# Patient Record
Sex: Male | Born: 1985 | Race: Black or African American | Hispanic: No | Marital: Single | Smoking: Former smoker
Health system: Southern US, Community
[De-identification: ages and names within clinical notes are randomized; demographics above are authoritative.]

## PROBLEM LIST (undated history)

## (undated) HISTORY — PX: HERNIA REPAIR: SHX51

---

## 2009-03-01 ENCOUNTER — Ambulatory Visit: Payer: Self-pay | Admitting: Interventional Radiology

## 2009-03-01 ENCOUNTER — Emergency Department (HOSPITAL_BASED_OUTPATIENT_CLINIC_OR_DEPARTMENT_OTHER): Admission: EM | Admit: 2009-03-01 | Discharge: 2009-03-01 | Payer: Self-pay | Admitting: Emergency Medicine

## 2012-09-02 ENCOUNTER — Emergency Department (HOSPITAL_BASED_OUTPATIENT_CLINIC_OR_DEPARTMENT_OTHER): Payer: Self-pay

## 2012-09-02 ENCOUNTER — Encounter (HOSPITAL_BASED_OUTPATIENT_CLINIC_OR_DEPARTMENT_OTHER): Payer: Self-pay | Admitting: *Deleted

## 2012-09-02 ENCOUNTER — Emergency Department (HOSPITAL_BASED_OUTPATIENT_CLINIC_OR_DEPARTMENT_OTHER)
Admission: EM | Admit: 2012-09-02 | Discharge: 2012-09-03 | Disposition: A | Discharge status: Home / Self Care | Payer: Self-pay | Attending: Emergency Medicine | Admitting: Emergency Medicine

## 2012-09-02 DIAGNOSIS — W268XXA Contact with other sharp object(s), not elsewhere classified, initial encounter: Secondary | ICD-10-CM | POA: Insufficient documentation

## 2012-09-02 DIAGNOSIS — S41109A Unspecified open wound of unspecified upper arm, initial encounter: Secondary | ICD-10-CM | POA: Insufficient documentation

## 2012-09-02 DIAGNOSIS — S51809A Unspecified open wound of unspecified forearm, initial encounter: Secondary | ICD-10-CM | POA: Insufficient documentation

## 2012-09-02 DIAGNOSIS — T148XXA Other injury of unspecified body region, initial encounter: Secondary | ICD-10-CM

## 2012-09-02 DIAGNOSIS — Y9389 Activity, other specified: Secondary | ICD-10-CM | POA: Insufficient documentation

## 2012-09-02 DIAGNOSIS — F17200 Nicotine dependence, unspecified, uncomplicated: Secondary | ICD-10-CM | POA: Insufficient documentation

## 2012-09-02 DIAGNOSIS — IMO0002 Reserved for concepts with insufficient information to code with codable children: Secondary | ICD-10-CM

## 2012-09-02 DIAGNOSIS — Z23 Encounter for immunization: Secondary | ICD-10-CM | POA: Insufficient documentation

## 2012-09-02 DIAGNOSIS — Y9289 Other specified places as the place of occurrence of the external cause: Secondary | ICD-10-CM | POA: Insufficient documentation

## 2012-09-02 MED ORDER — OXYCODONE-ACETAMINOPHEN 5-325 MG PO TABS
2.0000 | ORAL_TABLET | Freq: Once | ORAL | Status: AC
  Administered 2012-09-03: 2 via ORAL
  Filled 2012-09-02 (×2): qty 2

## 2012-09-02 MED ORDER — TETANUS-DIPHTH-ACELL PERTUSSIS 5-2.5-18.5 LF-MCG/0.5 IM SUSP
0.5000 mL | Freq: Once | INTRAMUSCULAR | Status: AC
  Administered 2012-09-02: 0.5 mL via INTRAMUSCULAR
  Filled 2012-09-02: qty 0.5

## 2012-09-02 NOTE — ED Notes (Signed)
I wrapped patient's right arm with a clean sheet, then poured sterile saline to loosen dried blood.

## 2012-09-02 NOTE — ED Provider Notes (Signed)
History     CSN: [id number]  Arrival date & time 09/02/12  2316   First MD Initiated Contact with Patient 09/02/12 2330      Chief Complaint  Patient presents with  . Laceration    (Consider location/radiation/quality/duration/timing/severity/associated sxs/prior treatment) HPI Comments: Patient presents with several small superficial skin tears of the right forearm and a laceration of the right upper arm.  He reports that just prior to arrival he locked his keys in the car and broke the car window in an attempt to get the keys.  He reports that the window broke and pieces of the glass cut his arm.  Bleeding controlled at this time.  He has full ROM of his arm.  His last tetanus was approximately ten years ago.  He denies numbness or tingling.    Patient is a 27 y.o. male presenting with skin laceration. The history is provided by the patient.  Laceration   History reviewed. No pertinent past medical history.  Past Surgical History  Procedure Laterality Date  . Hernia repair      History reviewed. No pertinent family history.  History  Substance Use Topics  . Smoking status: Current Some Day Smoker  . Smokeless tobacco: Not on file  . Alcohol Use: Yes      Review of Systems  Skin: Positive for wound.  All other systems reviewed and are negative.    Allergies  Review of patient's allergies indicates no known allergies.  Home Medications  No current outpatient prescriptions on file.  BP 135/79  Pulse 76  Temp(Src) 98.1 F (36.7 C) (Oral)  Resp 18  Ht 6' (1.829 m)  Wt 180 lb (81.647 kg)  BMI 24.41 kg/m2  SpO2 97%  Physical Exam  Nursing note and vitals reviewed. Constitutional: He appears well-developed and well-nourished. No distress.  HENT:  Head: Normocephalic and atraumatic.  Cardiovascular: Normal rate, regular rhythm and normal heart sounds.   Pulses:      Radial pulses are 2+ on the right side, and 2+ on the left side.  Pulmonary/Chest: Effort  normal and breath sounds normal.  Musculoskeletal:       Right shoulder: He exhibits normal range of motion.       Right elbow: He exhibits normal range of motion.       Right wrist: He exhibits normal range of motion.  Neurological: He is alert. He has normal strength. No sensory deficit.  Grip strength 5/5 bilaterally  Skin: Skin is warm and dry. He is not diaphoretic.     Several small 0.5 cm skin tears of the right forearm    ED Course  Procedures (including critical care time)  Labs Reviewed - No data to display No results found.   No diagnosis found.  LACERATION REPAIR Performed by: Anne Shutter, Kayl Stogdill Authorized by: Anne Shutter, Herbert Seta Consent: Verbal consent obtained. Risks and benefits: risks, benefits and alternatives were discussed Consent given by: patient Patient identity confirmed: provided demographic data Prepped and Draped in normal sterile fashion Wound explored  Laceration Location: right upper arm  Laceration Length: 4 cm  No Foreign Bodies seen or palpated  Anesthesia: local infiltration  Local anesthetic: lidocaine 2% with epinephrine  Anesthetic total: 6 ml  Irrigation method: syringe Amount of cleaning: standard  Skin closure: 4-0 Prolene  Number of sutures: 8  Technique: Simple interrupted.  Patient tolerance: Patient tolerated the procedure well with no immediate complications.  MDM  Patient presenting with a laceration of the right upper  arm and several small skin tears of his forearm that occurred while breaking a car window to get keys out of the car.  Wounds cleaned well.   Laceration repaired without difficulty.  Good approximation of the skin.  Patient neurovascularly intact.  Patient stable for discharge.        Pascal Lux McClelland, PA-C 09/04/12 1219

## 2012-09-02 NOTE — ED Notes (Signed)
Pt states he was trying to get his keys out of the car and his right hand and arm went through the glass. Multiple lacs noted to same.

## 2012-09-03 MED ORDER — HYDROCODONE-ACETAMINOPHEN 5-325 MG PO TABS: 1.0000 | ORAL_TABLET | Freq: Four times a day (QID) | ORAL | Status: DC | PRN

## 2012-09-03 NOTE — ED Notes (Signed)
Heather, PAC at bedside for laceration repair.

## 2012-09-03 NOTE — ED Notes (Signed)
I cleaned all patient's wounds, then applied bacitracin and placed both small and large dressings and covered with large kerlix over length of arm, then same on patient's hand. Hand wound continues to weep blood. I  advised patient to keep bandage in places till tomorrow. I also advised patient on keeping clean and dry. I gave patient additional wound care supplies.  Bleeding had stopped at time of discharge.

## 2012-09-03 NOTE — ED Notes (Signed)
EMT at bedside providing wound care.

## 2012-09-07 NOTE — ED Provider Notes (Signed)
Medical screening examination/treatment/procedure(s) were performed by non-physician practitioner and as supervising physician I was immediately available for consultation/collaboration.   Gwyneth Sprout, MD 09/07/12 [phone]

## 2013-04-14 ENCOUNTER — Encounter (HOSPITAL_BASED_OUTPATIENT_CLINIC_OR_DEPARTMENT_OTHER): Payer: Self-pay | Admitting: Emergency Medicine

## 2013-04-14 ENCOUNTER — Emergency Department (HOSPITAL_BASED_OUTPATIENT_CLINIC_OR_DEPARTMENT_OTHER)
Admission: EM | Admit: 2013-04-14 | Discharge: 2013-04-14 | Disposition: A | Discharge status: Home / Self Care | Payer: Self-pay | Attending: Emergency Medicine | Admitting: Emergency Medicine

## 2013-04-14 DIAGNOSIS — Y939 Activity, unspecified: Secondary | ICD-10-CM | POA: Insufficient documentation

## 2013-04-14 DIAGNOSIS — S61219A Laceration without foreign body of unspecified finger without damage to nail, initial encounter: Secondary | ICD-10-CM

## 2013-04-14 DIAGNOSIS — Z23 Encounter for immunization: Secondary | ICD-10-CM | POA: Insufficient documentation

## 2013-04-14 DIAGNOSIS — W268XXA Contact with other sharp object(s), not elsewhere classified, initial encounter: Secondary | ICD-10-CM | POA: Insufficient documentation

## 2013-04-14 DIAGNOSIS — Y929 Unspecified place or not applicable: Secondary | ICD-10-CM | POA: Insufficient documentation

## 2013-04-14 DIAGNOSIS — F17200 Nicotine dependence, unspecified, uncomplicated: Secondary | ICD-10-CM | POA: Insufficient documentation

## 2013-04-14 DIAGNOSIS — S61209A Unspecified open wound of unspecified finger without damage to nail, initial encounter: Secondary | ICD-10-CM | POA: Insufficient documentation

## 2013-04-14 MED ORDER — TETANUS-DIPHTH-ACELL PERTUSSIS 5-2.5-18.5 LF-MCG/0.5 IM SUSP
0.5000 mL | Freq: Once | INTRAMUSCULAR | Status: AC
  Administered 2013-04-14: 0.5 mL via INTRAMUSCULAR
  Filled 2013-04-14: qty 0.5

## 2013-04-14 NOTE — ED Provider Notes (Signed)
I have reviewed the report and personally reviewed the above radiology studies.  History/physical exam/procedure(s) were performed by non-physician practitioner and as supervising physician I was immediately available for consultation/collaboration. I have reviewed all notes and am in agreement with care and plan.   Hilario Quarry, MD 04/14/13 2204

## 2013-04-14 NOTE — ED Notes (Signed)
Laceration to left middle finger on metal plate.  Bleeding controlled.

## 2013-04-14 NOTE — ED Provider Notes (Signed)
CSN: [id number]     Arrival date & time 04/14/13  1626 History   First MD Initiated Contact with Patient 04/14/13 1711     Chief Complaint  Patient presents with  . Extremity Laceration   (Consider location/radiation/quality/duration/timing/severity/associated sxs/prior Treatment) Patient is a 27 y.o. male presenting with skin laceration. The history is provided by the patient. No language interpreter was used.  Laceration Location:  Finger Finger laceration location:  L middle finger Length (cm):  0.5 Depth:  Cutaneous Laceration mechanism:  Metal edge Foreign body present:  No foreign bodies Relieved by:  Nothing Tetanus status:  Out of date   History reviewed. No pertinent past medical history. Past Surgical History  Procedure Laterality Date  . Hernia repair     History reviewed. No pertinent family history. History  Substance Use Topics  . Smoking status: Current Some Day Smoker  . Smokeless tobacco: Not on file  . Alcohol Use: Yes    Review of Systems  Skin:       Laceration.    Allergies  Review of patient's allergies indicates no known allergies.  Home Medications  No current outpatient prescriptions on file. BP 139/68  Pulse 81  Temp(Src) 99.1 F (37.3 C) (Oral)  Resp 14  SpO2 99% Physical Exam  Skin:  Less than 1 cm laceration to dorsal 3rd left finger adjacent to PIP joint. Minimal gapping. FROM, no tendon deficits.    ED Course  Procedures (including critical care time) Labs Review Labs Reviewed - No data to display Imaging Review No results found.  EKG Interpretation   None     LACERATION REPAIR Performed by: Elpidio Anis A Authorized by: Elpidio Anis A Consent: Verbal consent obtained. Risks and benefits: risks, benefits and alternatives were discussed Consent given by: patient Patient identity confirmed: provided demographic data Prepped and Draped in normal sterile fashion Wound explored  Laceration Location: left middle  finger  Laceration Length: 0.5 cm  No Foreign Bodies seen or palpated  Anesthesia: local infiltration  Local anesthetic: lidocaine none% none epinephrine  Anesthetic total: none ml  Irrigation method: syringe Amount of cleaning: standard  Skin closure: dermabond, steri-strip  Number of sutures: none  Technique: n/a  Patient tolerance: Patient tolerated the procedure well with no immediate complications.   MDM  No diagnosis found. 1. Finger laceration, left middle    Arnoldo Hooker, PA-C 04/14/13 1813

## 2015-02-03 ENCOUNTER — Emergency Department (HOSPITAL_BASED_OUTPATIENT_CLINIC_OR_DEPARTMENT_OTHER)
Admission: EM | Admit: 2015-02-03 | Discharge: 2015-02-04 | Disposition: A | Discharge status: Home / Self Care | Payer: Self-pay | Attending: Emergency Medicine | Admitting: Emergency Medicine

## 2015-02-03 ENCOUNTER — Encounter (HOSPITAL_BASED_OUTPATIENT_CLINIC_OR_DEPARTMENT_OTHER): Payer: Self-pay | Admitting: Emergency Medicine

## 2015-02-03 ENCOUNTER — Emergency Department (HOSPITAL_BASED_OUTPATIENT_CLINIC_OR_DEPARTMENT_OTHER): Payer: Self-pay

## 2015-02-03 DIAGNOSIS — Y288XXA Contact with other sharp object, undetermined intent, initial encounter: Secondary | ICD-10-CM | POA: Insufficient documentation

## 2015-02-03 DIAGNOSIS — S91112A Laceration without foreign body of left great toe without damage to nail, initial encounter: Secondary | ICD-10-CM | POA: Insufficient documentation

## 2015-02-03 DIAGNOSIS — S91119A Laceration without foreign body of unspecified toe without damage to nail, initial encounter: Secondary | ICD-10-CM

## 2015-02-03 DIAGNOSIS — Y9289 Other specified places as the place of occurrence of the external cause: Secondary | ICD-10-CM | POA: Insufficient documentation

## 2015-02-03 DIAGNOSIS — Z720 Tobacco use: Secondary | ICD-10-CM | POA: Insufficient documentation

## 2015-02-03 DIAGNOSIS — Y9389 Activity, other specified: Secondary | ICD-10-CM | POA: Insufficient documentation

## 2015-02-03 DIAGNOSIS — Y998 Other external cause status: Secondary | ICD-10-CM | POA: Insufficient documentation

## 2015-02-03 MED ORDER — LIDOCAINE HCL (PF) 1 % IJ SOLN
10.0000 mL | Freq: Once | INTRAMUSCULAR | Status: AC
  Administered 2015-02-04: 5 mL
  Filled 2015-02-03: qty 10

## 2015-02-03 NOTE — ED Notes (Addendum)
Patient states that he was walking around with slides on and then felt something brush up against his left toe. The patient has a laceration to his left toe on the sole side of his foot. Patient is slurring his speech - reports that he has had some drinks earlier tonight.

## 2015-02-03 NOTE — ED Notes (Signed)
Pa  at bedside. 

## 2015-02-03 NOTE — ED Provider Notes (Signed)
CSN: [id number]     Arrival date & time 02/03/15  2154 History   First MD Initiated Contact with Patient 02/03/15 2330     Chief Complaint  Patient presents with  . Extremity Laceration     (Consider location/radiation/quality/duration/timing/severity/associated sxs/prior Treatment) HPI Comments: Patient presents today with a laceration to the plantar aspect of the left great toe that he sustained just prior to arrival.  He is unsure how he cut his toe.  He states that he was wearing flip flops and felt something scrape his toe, but is unsure what it was.  Patient admits to drinking alcohol prior to arrival.  He has been able to ambulate since the injury.  He denies any numbness or tingling.  He has full ROM of his toe.  He is unsure of the date of his last tetanus.    The history is provided by the patient.    History reviewed. No pertinent past medical history. Past Surgical History  Procedure Laterality Date  . Hernia repair     History reviewed. No pertinent family history. Social History  Substance Use Topics  . Smoking status: Current Some Day Smoker  . Smokeless tobacco: None  . Alcohol Use: Yes     Comment: drank 2 12 oz tonight    Review of Systems  Constitutional: Negative for fever and chills.  Skin: Positive for wound.  Neurological: Negative for numbness.      Allergies  Review of patient's allergies indicates no known allergies.  Home Medications   Prior to Admission medications   Not on File   BP 125/76 mmHg  Pulse 90  Temp(Src) 97.6 F (36.4 C) (Oral)  Resp 16  Ht  (1.854 m)  Wt 210 lb (95.255 kg)  BMI 27.71 kg/m2  SpO2 100% Physical Exam  Constitutional: He appears well-developed and well-nourished.  HENT:  Head: Normocephalic and atraumatic.  Neck: Normal range of motion.  Cardiovascular: Normal rate, regular rhythm and normal heart sounds.   Pulmonary/Chest: Effort normal and breath sounds normal.  Musculoskeletal:  Full ROM of the  left great toe  Neurological: He is alert.  Distal sensation of the left great toe intact  Skin: Skin is warm and dry.  3 cm u-shaped laceration to the base of the left great toe on the plantar aspect Good capillary refill of the left great toe  Psychiatric: He has a normal mood and affect.  Nursing note and vitals reviewed.   ED Course  Procedures (including critical care time) Labs Review Labs Reviewed - No data to display  Imaging Review Dg Foot Complete Left  02/03/2015   CLINICAL DATA:  Laceration to great toe  EXAM: LEFT FOOT - COMPLETE 3+ VIEW  COMPARISON:  None.  FINDINGS: No fracture dislocation of the first digit. No radiodense foreign body.  IMPRESSION: No fracture or foreign body.   Electronically Signed   By: Genevive Bi M.D.   On: 02/03/2015 22:57   I have personally reviewed and evaluated these images and lab results as part of my medical decision-making.   EKG Interpretation None     LACERATION REPAIR Performed by: Santiago Glad Authorized by: Santiago Glad Consent: Verbal consent obtained. Risks and benefits: risks, benefits and alternatives were discussed Consent given by: patient Patient identity confirmed: provided demographic data Prepped and Draped in normal sterile fashion Wound explored  Laceration Location:  Left great toe  Laceration Length: 3.5 cm  No Foreign Bodies seen or palpated  Anesthesia: digital block  Local anesthetic: lidocaine 2% without epinephrine  Anesthetic total: 5  ml  Irrigation method: syringe Amount of cleaning: standard  Skin closure: 3-0 Prolene  Number of sutures: 6  Technique: simple interrupted  Patient tolerance: Patient tolerated the procedure well with no immediate complications.  MDM   Final diagnoses:  None   Patient presents today with a laceration to the plantar aspect of the left great toe.  Xray negative.  Tetanus updated in the ED.  Laceration repaired with sutures without  difficulty.  Full ROM of the toe.  Neurovascularly intact.  Patient stable for discharge.  Return precautions given.      Santiago Glad, PA-C 02/04/15 1538  Paula Libra, MD 02/04/15 2240

## 2015-02-04 MED ORDER — TRAMADOL HCL 50 MG PO TABS
50.0000 mg | ORAL_TABLET | Freq: Once | ORAL | Status: AC
  Administered 2015-02-04: 50 mg via ORAL
  Filled 2015-02-04: qty 1

## 2015-02-04 NOTE — ED Notes (Signed)
PA at bedside to suture.

## 2015-03-09 ENCOUNTER — Encounter (HOSPITAL_BASED_OUTPATIENT_CLINIC_OR_DEPARTMENT_OTHER): Payer: Self-pay | Admitting: Emergency Medicine

## 2015-03-09 ENCOUNTER — Emergency Department (HOSPITAL_BASED_OUTPATIENT_CLINIC_OR_DEPARTMENT_OTHER)
Admission: EM | Admit: 2015-03-09 | Discharge: 2015-03-09 | Disposition: A | Discharge status: Home / Self Care | Payer: Self-pay | Attending: Emergency Medicine | Admitting: Emergency Medicine

## 2015-03-09 DIAGNOSIS — Z9889 Other specified postprocedural states: Secondary | ICD-10-CM | POA: Insufficient documentation

## 2015-03-09 DIAGNOSIS — R1031 Right lower quadrant pain: Secondary | ICD-10-CM | POA: Insufficient documentation

## 2015-03-09 DIAGNOSIS — Z720 Tobacco use: Secondary | ICD-10-CM | POA: Insufficient documentation

## 2015-03-09 DIAGNOSIS — Z8719 Personal history of other diseases of the digestive system: Secondary | ICD-10-CM

## 2015-03-09 MED ORDER — TRAMADOL HCL 50 MG PO TABS
50.0000 mg | ORAL_TABLET | Freq: Once | ORAL | Status: AC
  Administered 2015-03-09: 50 mg via ORAL
  Filled 2015-03-09: qty 1

## 2015-03-09 MED ORDER — TRAMADOL HCL 50 MG PO TABS: 50.0000 mg | ORAL_TABLET | Freq: Four times a day (QID) | ORAL | Status: DC | PRN

## 2015-03-09 NOTE — Discharge Instructions (Signed)
Follow-up with your surgeon or primary doctor on Monday.  You are welcome to return to the emergency department to undergo the CT scan that I have recommended.

## 2015-03-09 NOTE — ED Provider Notes (Signed)
CSN: [id number]     Arrival date & time 03/09/15  0205 History   First MD Initiated Contact with Patient 03/09/15 [phone]     Chief Complaint  Patient presents with  . Hernia     (Consider location/radiation/quality/duration/timing/severity/associated sxs/prior Treatment) HPI Comments: Patient is a 29 year old male with history of prior right inguinal hernia repair. This was performed approximately 5 years ago by a Careers adviser here in [place]. He presents today with complaints of pain for 1 month. He states that this began shortly after pushing a heavy cart around at work. He denies any bulge in this area. He denies any vomiting, or bowel complaints. He denies any fevers. He denies any urinary complaints.  The history is provided by the patient.    History reviewed. No pertinent past medical history. Past Surgical History  Procedure Laterality Date  . Hernia repair     No family history on file. Social History  Substance Use Topics  . Smoking status: Current Some Day Smoker  . Smokeless tobacco: None  . Alcohol Use: Yes     Comment: drank 2 12 oz tonight    Review of Systems  All other systems reviewed and are negative.     Allergies  Review of patient's allergies indicates no known allergies.  Home Medications   Prior to Admission medications   Not on File   BP 126/66 mmHg  Pulse 75  Temp(Src) 98 F (36.7 C) (Oral)  Resp 18  Ht  (1.854 m)  Wt 190 lb (86.183 kg)  BMI 25.07 kg/m2  SpO2 98% Physical Exam  Constitutional: He is oriented to person, place, and time. He appears well-developed and well-nourished. No distress.  HENT:  Head: Normocephalic and atraumatic.  Neck: Normal range of motion. Neck supple.  Abdominal: Soft. Bowel sounds are normal.  There is tenderness to palpation in the right inguinal region. There is no palpable bulge.  Musculoskeletal: Normal range of motion. He exhibits no edema.  Neurological: He is alert and oriented to person,  place, and time.  Skin: Skin is warm and dry. He is not diaphoretic.  Nursing note and vitals reviewed.   ED Course  Procedures (including critical care time) Labs Review Labs Reviewed - No data to display  Imaging Review No results found. I have personally reviewed and evaluated these images and lab results as part of my medical decision-making.   EKG Interpretation None      MDM   Final diagnoses:  None    Patient presents with complaints of pain in his right groin. He is concerned he may have reinjured his hernia which was previously repaired. Patient was difficult to get an exam on as he repeatedly pushed my hands away. My intent was to perform a CT scan to rule out recurrence of the hernia, however the patient refuses this. He is requesting something for his pain and a work note. He tells me he will follow-up with his surgeon or primary doctor on Monday.     Geoffery Lyons, MD 03/09/15 [phone]

## 2015-03-09 NOTE — ED Notes (Signed)
Pt states he had hernia repair on right side. States he feels like he may have re-injured it. Rates pain 8/10 when walking or moving.

## 2015-04-25 ENCOUNTER — Emergency Department (HOSPITAL_BASED_OUTPATIENT_CLINIC_OR_DEPARTMENT_OTHER)
Admission: EM | Admit: 2015-04-25 | Discharge: 2015-04-25 | Disposition: A | Discharge status: Home / Self Care | Payer: Self-pay | Attending: Emergency Medicine | Admitting: Emergency Medicine

## 2015-04-25 ENCOUNTER — Encounter (HOSPITAL_BASED_OUTPATIENT_CLINIC_OR_DEPARTMENT_OTHER): Payer: Self-pay

## 2015-04-25 DIAGNOSIS — R110 Nausea: Secondary | ICD-10-CM | POA: Insufficient documentation

## 2015-04-25 DIAGNOSIS — R197 Diarrhea, unspecified: Secondary | ICD-10-CM | POA: Insufficient documentation

## 2015-04-25 DIAGNOSIS — R05 Cough: Secondary | ICD-10-CM | POA: Insufficient documentation

## 2015-04-25 DIAGNOSIS — Z87891 Personal history of nicotine dependence: Secondary | ICD-10-CM | POA: Insufficient documentation

## 2015-04-25 MED ORDER — ONDANSETRON 4 MG PO TBDP: ORAL_TABLET | ORAL | Status: DC

## 2015-04-25 NOTE — Discharge Instructions (Signed)

## 2015-04-25 NOTE — ED Provider Notes (Signed)
CSN: [id number]     Arrival date & time 04/25/15  1839 History  By signing my name below, I, Luis Graves, attest that this documentation has been prepared under the direction and in the presence of Luis MoMatthew Jamey Harman, MD. Electronically Signed: Budd PalmerVanessa Graves, ED Scribe. 04/25/2015. 7:12 PM.    Chief Complaint  Patient presents with  . Diarrhea   Patient is a 29 y.o. male presenting with diarrhea. The history is provided by the patient. No language interpreter was used.  Diarrhea Severity:  Moderate Onset quality:  Gradual Duration:  1 day Timing:  Intermittent Progression:  Unchanged Associated symptoms: cough   Associated symptoms: no abdominal pain, no fever and no vomiting    HPI Comments: Luis RickerJerrell Graves is a 29 y.o. male former smoker with a PSHx of hernia repair who presents to the Emergency Department complaining of intermittent diarrhea onset 1 day ago. He reports associated nausea and cough. He believes this may be a stomach virus. 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 Pt denies vomiting, bloody stool, abdominal pain, fever, and congestion.   History reviewed. No pertinent past medical history. Past Surgical History  Procedure Laterality Date  . Hernia repair     No family history on file. Social History  Substance Use Topics  . Smoking status: Former Games developermoker  . Smokeless tobacco: None  . Alcohol Use: Yes     Comment: occ    Review of Systems  Constitutional: Negative for fever.  Respiratory: Positive for cough.   Gastrointestinal: Positive for nausea and diarrhea. Negative for vomiting, abdominal pain and blood in stool.  All other systems reviewed and are negative.   Allergies  Review of patient's allergies indicates no known allergies.  Home Medications   Prior to Admission medications   Medication Sig Start Date End Date Taking? Authorizing Provider  ondansetron (ZOFRAN ODT) 4 MG disintegrating tablet 4mg  ODT q4 hours prn nausea/vomit 04/25/15   Luis MoMatthew  Luis Wainright, MD   BP 126/77 mmHg  Pulse 76  Temp(Src) 98.8 F (37.1 C) (Oral)  Resp 18  Ht 6\' 1"  (1.854 m)  Wt 180 lb (81.647 kg)  BMI 23.75 kg/m2  SpO2 97% Physical Exam  Constitutional: He is oriented to person, place, and time. He appears well-developed and well-nourished.  HENT:  Head: Normocephalic and atraumatic.  Eyes: Conjunctivae and EOM are normal.  Neck: Normal range of motion. Neck supple.  Cardiovascular: Normal rate, regular rhythm and normal heart sounds.   Pulmonary/Chest: Effort normal and breath sounds normal. No respiratory distress.  Abdominal: He exhibits no distension. There is no tenderness. There is no rebound and no guarding.  Musculoskeletal: Normal range of motion.  Neurological: He is alert and oriented to person, place, and time.  Skin: Skin is warm and dry.  Vitals reviewed.   ED Course  Procedures  DIAGNOSTIC STUDIES: Oxygen Saturation is 97% on RA, normal by my interpretation.    COORDINATION OF CARE: 7:00 PM - Discussed probable stomach virus. Discussed plans to order anti-nausea medication to be taken if nausea worsens significantly. Will give a note for work. Pt advised of plan for treatment and pt agrees.  Labs Review Labs Reviewed - No data to display  Imaging Review No results found. I have personally reviewed and evaluated these images and lab results as part of my medical decision-making.   EKG Interpretation None      MDM   Final diagnoses:  Diarrhea, unspecified type    29 y.o. male without pertinent PMH  presents with uncomplicated diarrhea.  Well appearing, no abd pain or tenderness.  DC home in stable condition.    I have reviewed all laboratory and imaging studies if ordered as above  1. Diarrhea, unspecified type           Luis Mo, MD 04/25/15 [phone]

## 2015-04-25 NOTE — ED Notes (Signed)
Diarrhea x 3 days

## 2015-06-26 ENCOUNTER — Encounter (HOSPITAL_BASED_OUTPATIENT_CLINIC_OR_DEPARTMENT_OTHER): Payer: Self-pay | Admitting: *Deleted

## 2015-06-26 ENCOUNTER — Emergency Department (HOSPITAL_BASED_OUTPATIENT_CLINIC_OR_DEPARTMENT_OTHER): Payer: BLUE CROSS/BLUE SHIELD

## 2015-06-26 ENCOUNTER — Emergency Department (HOSPITAL_BASED_OUTPATIENT_CLINIC_OR_DEPARTMENT_OTHER)
Admission: EM | Admit: 2015-06-26 | Discharge: 2015-06-26 | Disposition: A | Discharge status: Home / Self Care | Payer: BLUE CROSS/BLUE SHIELD | Attending: Emergency Medicine | Admitting: Emergency Medicine

## 2015-06-26 DIAGNOSIS — B349 Viral infection, unspecified: Secondary | ICD-10-CM | POA: Diagnosis not present

## 2015-06-26 DIAGNOSIS — Z87891 Personal history of nicotine dependence: Secondary | ICD-10-CM | POA: Insufficient documentation

## 2015-06-26 DIAGNOSIS — R059 Cough, unspecified: Secondary | ICD-10-CM

## 2015-06-26 DIAGNOSIS — R590 Localized enlarged lymph nodes: Secondary | ICD-10-CM | POA: Diagnosis not present

## 2015-06-26 DIAGNOSIS — R05 Cough: Secondary | ICD-10-CM

## 2015-06-26 DIAGNOSIS — R509 Fever, unspecified: Secondary | ICD-10-CM | POA: Diagnosis present

## 2015-06-26 MED ORDER — SODIUM CHLORIDE 0.9 % IV BOLUS (SEPSIS)
1000.0000 mL | Freq: Once | INTRAVENOUS | Status: AC
  Administered 2015-06-26: 1000 mL via INTRAVENOUS

## 2015-06-26 MED ORDER — KETOROLAC TROMETHAMINE 30 MG/ML IJ SOLN
30.0000 mg | Freq: Once | INTRAMUSCULAR | Status: AC
  Administered 2015-06-26: 30 mg via INTRAVENOUS
  Filled 2015-06-26: qty 1

## 2015-06-26 MED ORDER — GUAIFENESIN ER 600 MG PO TB12: 600.0000 mg | ORAL_TABLET | Freq: Two times a day (BID) | ORAL | Status: DC | PRN

## 2015-06-26 MED ORDER — BENZONATATE 100 MG PO CAPS: 100.0000 mg | ORAL_CAPSULE | Freq: Three times a day (TID) | ORAL | Status: DC

## 2015-06-26 MED ORDER — ONDANSETRON 4 MG PO TBDP: 4.0000 mg | ORAL_TABLET | Freq: Three times a day (TID) | ORAL | Status: DC | PRN

## 2015-06-26 MED ORDER — GI COCKTAIL ~~LOC~~
30.0000 mL | Freq: Once | ORAL | Status: AC
  Administered 2015-06-26: 30 mL via ORAL
  Filled 2015-06-26: qty 30

## 2015-06-26 MED ORDER — IBUPROFEN 800 MG PO TABS: 800.0000 mg | ORAL_TABLET | Freq: Three times a day (TID) | ORAL | Status: DC

## 2015-06-26 NOTE — ED Provider Notes (Signed)
CSN: [id number]     Arrival date & time 06/26/15  1750 History  By signing my name below, I, Tanda Rockers, attest that this documentation has been prepared under the direction and in the presence of [name], [place]. Electronically Signed: Tanda Rockers, ED Scribe. 06/26/2015. 6:49 PM.   Chief Complaint  Patient presents with  . Fever   The history is provided by the patient. No language interpreter was used.     HPI Comments: Luis Graves is a 30 y.o. male who presents to the Emergency Department complaining of gradual onset, constant, productive cough with green phlegm x 2 days. Pt also complains of subjective fever, chills, sweats, headache, generalized body aches, diarrhea, and nausea. Pt has been taking 800 mg Ibuprofen and Theraflu with some relief. He did not receive the flu vaccine this year. No recent sick contact with similar symptoms. Denies vomiting, abdominal pain, or any other associated symptoms.    History reviewed. No pertinent past medical history. Past Surgical History  Procedure Laterality Date  . Hernia repair     No family history on file. Social History  Substance Use Topics  . Smoking status: Former Games developer  . Smokeless tobacco: None  . Alcohol Use: Yes     Comment: occ    Review of Systems  Constitutional: Positive for fever (Subjective), chills and diaphoresis.  Respiratory: Positive for cough.   Gastrointestinal: Positive for nausea and diarrhea. Negative for vomiting and abdominal pain.  Musculoskeletal: Positive for myalgias.  Neurological: Positive for headaches.   Allergies  Review of patient's allergies indicates no known allergies.  Home Medications   Prior to Admission medications   Medication Sig Start Date End Date Taking? Authorizing Provider  ondansetron (ZOFRAN ODT) 4 MG disintegrating tablet  ODT q4 hours prn nausea/vomit 04/25/15   Mirian Mo, MD   BP 114/74 mmHg  Pulse 74  Temp(Src) 98.7 F (37.1 C) (Oral)  Resp 18   Ht  (1.854 m)  Wt 180 lb (81.647 kg)  BMI 23.75 kg/m2  SpO2 98%   Physical Exam  Constitutional: He is oriented to person, place, and time. He appears well-developed and well-nourished. No distress.  HENT:  Head: Normocephalic and atraumatic.  Nose: Mucosal edema and rhinorrhea present. Right sinus exhibits no maxillary sinus tenderness and no frontal sinus tenderness. Left sinus exhibits no maxillary sinus tenderness and no frontal sinus tenderness.  Eyes: Conjunctivae and EOM are normal.  Neck: Neck supple. No tracheal deviation present.  Bilateral cervical lymphadenopathy  Cardiovascular: Normal rate.   Pulmonary/Chest: Effort normal. No respiratory distress. He has wheezes. He has no rhonchi. He has no rales.  Left sided few, soft, expiratory wheezes that clear with coughing  Musculoskeletal: Normal range of motion.  Neurological: He is alert and oriented to person, place, and time.  Skin: Skin is warm and dry.  Psychiatric: He has a normal mood and affect. His behavior is normal.  Nursing note and vitals reviewed.   ED Course  Procedures (including critical care time)  DIAGNOSTIC STUDIES: Oxygen Saturation is 98% on RA, normal by my interpretation.    COORDINATION OF CARE: 6:47 PM-Discussed treatment plan which includes CXR with pt at bedside and pt agreed to plan.   Labs Review Labs Reviewed  INFLUENZA PANEL BY PCR (TYPE A & B, H1N1)    Imaging Review Dg Chest 2 View  06/26/2015  CLINICAL DATA:  Fever, cough, chills and body aches for 2 days. Initial encounter. EXAM: CHEST  2 VIEW COMPARISON:  None. FINDINGS: The lungs are clear. Heart size is normal. There is no pneumothorax or pleural effusion. No focal bony abnormality is identified. IMPRESSION: Negative chest. Electronically Signed   By: Drusilla Kanner M.D.   On: 06/26/2015 19:45   I have personally reviewed and evaluated these images and labs as part of my medical decision-making.   EKG  Interpretation None      MDM   Final diagnoses:  Viral syndrome  Cough    Pt reports improvement in pain with toradol. CXR negative. Sent off influena pcr though as I discussed with pt this will not change my treatment. Pt is not hypoxic. He is afebrile. No tachypnea or tachycardia. Likely viral URI. Rx given for supportive meds including motrin, mucinex, tessalon, and zofran.   Prior to discharge pt complaining of heartburn which is common for him. GI cocktail given.     I personally performed the services described in this documentation, which was scribed in my presence. The recorded information has been reviewed and is accurate.      Carlene Coria, PA-C 06/26/15 2025  Alvira Monday, MD 06/27/15 1309

## 2015-06-26 NOTE — ED Notes (Signed)
Fever, cough, chills, body aches x 2 days.

## 2015-06-26 NOTE — Discharge Instructions (Signed)
Your chest x-ray was normal today. You likely have a virus. I will give you prescriptions to help with your symptoms. Drink plenty of fluids to stay hydrated.  Take medications as prescribed. Return to the emergency room for worsening condition or new concerning symptoms. Follow up with your regular doctor. If you don't have a regular doctor use one of the numbers below to establish a primary care doctor.   Emergency Department Resource Guide 1) Find a Doctor and Pay Out of Pocket Although you won't have to find out who is covered by your insurance plan, it is a good idea to ask around and get recommendations. You will then need to call the office and see if the doctor you have chosen will accept you as a new patient and what types of options they offer for patients who are self-pay. Some doctors offer discounts or will set up payment plans for their patients who do not have insurance, but you will need to ask so you aren't surprised when you get to your appointment.  2) Contact Your Local Health Department Not all health departments have doctors that can see patients for sick visits, but many do, so it is worth a call to see if yours does. If you don't know where your local health department is, you can check in your phone book. The CDC also has a tool to help you locate your state's health department, and many state websites also have listings of all of their local health departments.  3) Find a Walk-in Clinic If your illness is not likely to be very severe or complicated, you may want to try a walk in clinic. These are popping up all over the country in pharmacies, drugstores, and shopping centers. They're usually staffed by nurse practitioners or physician assistants that have been trained to treat common illnesses and complaints. They're usually fairly quick and inexpensive. However, if you have serious medical issues or chronic medical problems, these are probably not your best option.  No  Primary Care Doctor: - Call Health Connect at  [phone] - they can help you locate a primary care doctor that  accepts your insurance, provides certain services, etc. - Physician Referral [phone]  Emergency Department Resource Guide 1) Find a Doctor and Pay Out of Pocket Although you won't have to find out who is covered by your insurance plan, it is a good idea to ask around and get recommendations. You will then need to call the office and see if the doctor you have chosen will accept you as a new patient and what types of options they offer for patients who are self-pay. Some doctors offer discounts or will set up payment plans for their patients who do not have insurance, but you will need to ask so you aren't surprised when you get to your appointment.  2) Contact Your Local Health Department Not all health departments have doctors that can see patients for sick visits, but many do, so it is worth a call to see if yours does. If you don't know where your local health department is, you can check in your phone book. The CDC also has a tool to help you locate your state's health department, and many state websites also have listings of all of their local health departments.  3) Find a Walk-in Clinic If your illness is not likely to be very severe or complicated, you may want to try a walk in clinic. These are popping up all over the  country in pharmacies, drugstores, and shopping centers. They're usually staffed by nurse practitioners or physician assistants that have been trained to treat common illnesses and complaints. They're usually fairly quick and inexpensive. However, if you have serious medical issues or chronic medical problems, these are probably not your best option.  No Primary Care Doctor: - Call Health Connect at  [phone] - they can help you locate a primary care doctor that  accepts your insurance, provides certain services, etc. - Physician Referral Service-  [phone]  Chronic Pain Problems: Organization         Address  Phone   Notes  [facility]  [phone] Patients need to be referred by their primary care doctor.   Medication Assistance: Organization         Address  Phone   Notes  [facility] Medication [facility] [address] [place], [place] [address] [phone] --Must be a resident of [facility] -- Must have NO insurance coverage whatsoever (no Medicaid/ Medicare, etc.) -- The pt. MUST have a primary care doctor that directs their care regularly and follows them in the community   [facility]  [phone]   [employer]  [phone]    Agencies that provide inexpensive medical care: Organization         Address  Phone   Notes  [facility]  [phone]   [facility]    [phone]   [facility] [place], [place] [address] [phone]   [facility] [address] [place] [phone]   [facility]    [phone]   [facility]    [phone]   [place] [address], [place] Phone:  [phone], Fax:  [phone] Hours of Operation:  9 am - 6 pm, M-F.  Also accepts Medicaid/Medicare and self-pay.  [facility] [place] for [place], [address], [place] Phone: [phone], Fax: [phone]. Hours of Operation:  8:30 am - 5:30 pm, M-F.  Also accepts Medicaid and self-pay.  [facility] [place] [address], [place] Phone: [phone]   [place], [place], [place] [phone], Ext. [phone] Mondays & Thursdays: 7-9 AM.  First 15 patients are seen on a first come, first serve basis.    [place] Providers:  Organization         Address  Phone   Notes  [facility] [address],  [place] [phone] Also accepts self-pay patients.  [facility] [address], [place]  [phone]   [place], [address], [place] [phone]   [facility] [address] [place] [phone]   Lucianne Lei [address], [place]   [phone] Only accepts [place] patients after they have their name applied to their card.   Self-Pay (no insurance) in [facility]:  Organization         Address  Phone   Notes  Sickle Cell Patients, [facility] [place] [phone]   [facility] [place] [phone]   [facility] [place]  [place] [address], [place] [phone]) [phone]   [facility]/Dr. Osei-Bonsu  [address] [place] [address], [place] or [address], [place] [phone] Phone number for both [place] and [place] locations is the same.  [facility] [address], [place] [phone]   [facility] [place] [address] [place] [address], [place] or [address] [phone]) [phone]   [facility] [address], [place] [phone], phone; [phone], fax Sees patients 1st and 3rd Saturday of every month.  Must not qualify for public or private insurance (i.e. Medicaid, Medicare, [place] Health Choice, Veterans' Benefits)  Household income should be no more than 200% of the poverty level The clinic cannot treat you if you are pregnant or think you are pregnant  Sexually transmitted diseases are not treated at the clinic.

## 2015-06-27 LAB — INFLUENZA PANEL BY PCR (TYPE A & B)
H1N1FLUPCR: NOT DETECTED
INFLAPCR: NEGATIVE
Influenza B By PCR: NEGATIVE

## 2016-06-19 ENCOUNTER — Encounter (HOSPITAL_COMMUNITY): Payer: Self-pay

## 2016-06-19 ENCOUNTER — Emergency Department (HOSPITAL_COMMUNITY)
Admission: EM | Admit: 2016-06-19 | Discharge: 2016-06-19 | Disposition: A | Discharge status: Home / Self Care | Payer: BLUE CROSS/BLUE SHIELD | Attending: Emergency Medicine | Admitting: Emergency Medicine

## 2016-06-19 DIAGNOSIS — K0889 Other specified disorders of teeth and supporting structures: Secondary | ICD-10-CM | POA: Insufficient documentation

## 2016-06-19 DIAGNOSIS — Z5321 Procedure and treatment not carried out due to patient leaving prior to being seen by health care provider: Secondary | ICD-10-CM | POA: Insufficient documentation

## 2016-06-19 NOTE — ED Triage Notes (Signed)
Pt reports right sided dental pain. Pt reports he broke a tooth on the right lower side. Pt A+OX4, speaking in complete sentences.

## 2016-06-19 NOTE — ED Notes (Signed)
Called pt back to fast track x2 with no response.

## 2016-07-17 ENCOUNTER — Encounter (HOSPITAL_COMMUNITY): Payer: Self-pay | Admitting: Emergency Medicine

## 2016-07-17 ENCOUNTER — Emergency Department (HOSPITAL_COMMUNITY)
Admission: EM | Admit: 2016-07-17 | Discharge: 2016-07-17 | Disposition: A | Discharge status: Home / Self Care | Payer: BLUE CROSS/BLUE SHIELD | Attending: Emergency Medicine | Admitting: Emergency Medicine

## 2016-07-17 DIAGNOSIS — Z87891 Personal history of nicotine dependence: Secondary | ICD-10-CM | POA: Insufficient documentation

## 2016-07-17 DIAGNOSIS — K0889 Other specified disorders of teeth and supporting structures: Secondary | ICD-10-CM

## 2016-07-17 DIAGNOSIS — Z79899 Other long term (current) drug therapy: Secondary | ICD-10-CM | POA: Insufficient documentation

## 2016-07-17 MED ORDER — PENICILLIN V POTASSIUM 500 MG PO TABS: 500.0000 mg | ORAL_TABLET | Freq: Four times a day (QID) | ORAL | 0 refills | Status: AC

## 2016-07-17 MED ORDER — OXYCODONE-ACETAMINOPHEN 5-325 MG PO TABS
1.0000 | ORAL_TABLET | Freq: Once | ORAL | Status: AC
  Administered 2016-07-17: 1 via ORAL
  Filled 2016-07-17: qty 1

## 2016-07-17 MED ORDER — TRAMADOL HCL 50 MG PO TABS: 50.0000 mg | ORAL_TABLET | Freq: Four times a day (QID) | ORAL | 0 refills | Status: DC | PRN

## 2016-07-17 MED ORDER — IBUPROFEN 800 MG PO TABS: 800.0000 mg | ORAL_TABLET | Freq: Four times a day (QID) | ORAL | 0 refills | Status: DC | PRN

## 2016-07-17 NOTE — ED Provider Notes (Signed)
[facility]-EMERGENCY DEPT Provider Note   CSN: [id number] Arrival date & time: 07/17/16  2039  By signing my name below, I, Linna Darner, attest that this documentation has been prepared under the direction and in the presence of Audry Pili, PA-C. Electronically Signed: Linna Darner, Scribe. 07/17/2016. 9:53 PM.  History   Chief Complaint Chief Complaint  Patient presents with  . Dental Pain    The history is provided by the patient. No language interpreter was used.     HPI Comments: Luis Graves is a 31 y.o. male who presents to the Emergency Department complaining of intermittent right upper dental pain for one month, constant and severe today. He states his pain is "excruciating." Pt states he chipped part of one of his right upper teeth about a month ago after biting down on a popcorn kernel; he notes he had a filling in the tooth. He endorses pain radiation up the right side of his face. Pt states his pain is worse with applied pressure to the tooth. He tried Tylenol 3 PTA today with minimal improvement of his pain. Pt denies fever, chills, or any other associated symptoms. No dentist.  History reviewed. No pertinent past medical history.  There are no active problems to display for this patient.   Past Surgical History:  Procedure Laterality Date  . HERNIA REPAIR         Home Medications    Prior to Admission medications   Medication Sig Start Date End Date Taking? Authorizing Provider  benzonatate (TESSALON) 100 MG capsule Take 1 capsule (100 mg total) by mouth every 8 (eight) hours. 06/26/15   Ace Gins Sam, PA-C  guaiFENesin (MUCINEX) 600 MG 12 hr tablet Take 1 tablet (600 mg total) by mouth 2 (two) times daily as needed for to loosen phlegm. 06/26/15   Ace Gins Sam, PA-C  ibuprofen (ADVIL,MOTRIN) 800 MG tablet Take 1 tablet (800 mg total) by mouth 3 (three) times daily. 06/26/15   Ace Gins Sam, PA-C  ondansetron (ZOFRAN ODT) 4 MG disintegrating tablet 4mg  ODT q4 hours  prn nausea/vomit 04/25/15   Mirian Mo, MD  ondansetron (ZOFRAN ODT) 4 MG disintegrating tablet Take 1 tablet (4 mg total) by mouth every 8 (eight) hours as needed for nausea or vomiting. 06/26/15   Carlene Coria, PA-C    Family History History reviewed. No pertinent family history.  Social History Social History  Substance Use Topics  . Smoking status: Former Games developer  . Smokeless tobacco: Never Used  . Alcohol use Yes     Comment: occ     Allergies   Patient has no known allergies.   Review of Systems Review of Systems  Constitutional: Negative for chills and fever.  HENT: Positive for dental problem.      Physical Exam Updated Vital Signs BP 142/99 (BP Location: Left Arm)   Pulse (!) 56   Temp 98 F (36.7 C) (Oral)   Resp 18   Ht 6' (1.829 m)   Wt 215 lb (97.5 kg)   SpO2 100%   BMI 29.16 kg/m   Physical Exam  Constitutional: He is oriented to person, place, and time. Vital signs are normal. He appears well-developed and well-nourished. No distress.  HENT:  Head: Normocephalic and atraumatic.  Right Ear: Hearing normal.  Left Ear: Hearing normal.  Mouth/Throat: Uvula is midline, oropharynx is clear and moist and mucous membranes are normal. No trismus in the jaw. Abnormal dentition. Dental caries present. No dental abscesses or uvula swelling. No  oropharyngeal exudate, posterior oropharyngeal edema, posterior oropharyngeal erythema or tonsillar abscesses.  No Trismus. No dental abscess. ROM neck intact without pain. Noted poor dentition with chipped tooth on right lower molar. No abscess. No erythema or purulence.   Eyes: Conjunctivae and EOM are normal. Pupils are equal, round, and reactive to light.  Neck: Neck supple. No tracheal deviation present.  Cardiovascular: Normal rate and regular rhythm.   Pulmonary/Chest: Effort normal. No respiratory distress.  Musculoskeletal: Normal range of motion.  Neurological: He is alert and oriented to person, place, and  time.  Skin: Skin is warm and dry.  Psychiatric: He has a normal mood and affect. His speech is normal and behavior is normal. Thought content normal.  Nursing note and vitals reviewed.   ED Treatments / Results  Labs (all labs ordered are listed, but only abnormal results are displayed) Labs Reviewed - No data to display  EKG  EKG Interpretation None       Radiology No results found.  Procedures Procedures (including critical care time)  DIAGNOSTIC STUDIES: Oxygen Saturation is *100% on RA, normal by my interpretation.    COORDINATION OF CARE: 9:58 PM Discussed treatment plan with pt at bedside and pt agreed to plan.  Medications Ordered in ED Medications - No data to display   Initial Impression / Assessment and Plan / ED Course  I have reviewed the triage vital signs and the nursing notes.  Pertinent labs & imaging results that were available during my care of the patient were reviewed by me and considered in my medical decision making (see chart for details).  Final Clinical Impressions(s) / ED Diagnoses  I have reviewed the relevant previous healthcare records. I obtained HPI from historian.  ED Course:  Assessment: Dental pain associated with dental cary but no signs or symptoms of dental abscess with patient afebrile, non toxic appearing and swallowing secretions well. Exam unconcerning for Ludwig's angina or other deep tissue infection in neck. As there is no facial swelling or gum findings. Will treat with pain medication. Given Rx Penicillin due to duration of symptoms and abscess prevention.  I gave patient referral to dentist and stressed the importance of dental follow up for ultimate management of dental pain. Patient voices understanding and is agreeable to plan.  Disposition/Plan:  DC Home Additional Verbal discharge instructions given and discussed with patient.  Pt Instructed to f/u with Detist in the next week for evaluation and treatment of  symptoms. Return precautions given Pt acknowledges and agrees with plan  Supervising Physician Nira ConnPedro Eduardo Cardama, MD  Final diagnoses:  Pain, dental    New Prescriptions New Prescriptions   No medications on file    I personally performed the services described in this documentation, which was scribed in my presence. The recorded information has been reviewed and is accurate.    Audry Piliyler Usiel Astarita, PA-C 07/17/16 2212    Nira ConnPedro Eduardo Cardama, MD 07/18/16 [phone]

## 2016-07-17 NOTE — ED Triage Notes (Signed)
Pt awoke this morning at 0200 with pain in upperjaw on R side that radiates to ear and lower jaw.  He feels that he has a cavity on the upper right side.  He knows that he has a broken tooth on lower right side.  Pt has no dental coverage currently

## 2016-07-17 NOTE — Discharge Instructions (Signed)
Please read and follow all provided instructions.  Your diagnoses today include:  1. Pain, dental    Tests performed today include: Vital signs. See below for your results today.   Medications prescribed:  Take as prescribed   Home care instructions:  Follow any educational materials contained in this packet.  Follow-up instructions: Please follow-up with your Dentist for further evaluation of symptoms and treatment   Please Contact this number: [phone] to get into contact with an emergency dental service to schedule an appointment with a local dentist   Return instructions:  Please return to the Emergency Department if you do not get better, if you get worse, or new symptoms OR  - Fever (temperature greater than 101.90F)  - Bleeding that does not stop with holding pressure to the area    -Severe pain (please note that you may be more sore the day after your accident)  - Chest Pain  - Difficulty breathing  - Severe nausea or vomiting  - Inability to tolerate food and liquids  - Passing out  - Skin becoming red around your wounds  - Change in mental status (confusion or lethargy)  - New numbness or weakness    Please return if you have any other emergent concerns.  Additional Information:  Your vital signs today were: BP 142/99 (BP Location: Left Arm)    Pulse (!) 56    Temp 98 F (36.7 C) (Oral)    Resp 18    Ht 6' (1.829 m)    Wt 97.5 kg    SpO2 100%    BMI 29.16 kg/m  If your blood pressure (BP) was elevated above 135/85 this visit, please have this repeated by your doctor within one month. ---------------

## 2016-08-10 HISTORY — PX: CERVICAL FUSION: SHX112

## 2016-08-26 ENCOUNTER — Emergency Department (HOSPITAL_COMMUNITY)
Admission: EM | Admit: 2016-08-26 | Discharge: 2016-08-26 | Disposition: A | Discharge status: Home / Self Care | Payer: PRIVATE HEALTH INSURANCE | Attending: Emergency Medicine | Admitting: Emergency Medicine

## 2016-08-26 ENCOUNTER — Encounter (HOSPITAL_COMMUNITY): Payer: Self-pay

## 2016-08-26 DIAGNOSIS — K047 Periapical abscess without sinus: Secondary | ICD-10-CM | POA: Diagnosis not present

## 2016-08-26 DIAGNOSIS — M542 Cervicalgia: Secondary | ICD-10-CM | POA: Diagnosis not present

## 2016-08-26 DIAGNOSIS — H1031 Unspecified acute conjunctivitis, right eye: Secondary | ICD-10-CM | POA: Insufficient documentation

## 2016-08-26 DIAGNOSIS — Z87891 Personal history of nicotine dependence: Secondary | ICD-10-CM | POA: Diagnosis not present

## 2016-08-26 DIAGNOSIS — K0889 Other specified disorders of teeth and supporting structures: Secondary | ICD-10-CM | POA: Diagnosis present

## 2016-08-26 MED ORDER — HYDROCODONE-ACETAMINOPHEN 5-325 MG PO TABS
2.0000 | ORAL_TABLET | Freq: Once | ORAL | Status: AC
  Administered 2016-08-26: 2 via ORAL
  Filled 2016-08-26: qty 2

## 2016-08-26 MED ORDER — AMOXICILLIN 500 MG PO CAPS: 500.0000 mg | ORAL_CAPSULE | Freq: Three times a day (TID) | ORAL | 0 refills | Status: DC

## 2016-08-26 MED ORDER — HYDROCODONE-ACETAMINOPHEN 5-325 MG PO TABS: 2.0000 | ORAL_TABLET | ORAL | 0 refills | Status: DC | PRN

## 2016-08-26 MED ORDER — TOBRAMYCIN 0.3 % OP SOLN: 2.0000 [drp] | OPHTHALMIC | 0 refills | Status: DC

## 2016-08-26 NOTE — Discharge Instructions (Signed)
Return if any problems.

## 2016-08-26 NOTE — ED Triage Notes (Signed)
PT C/O NECK PAIN RADIATING DOWN HIS BACK X2 WEEKS. PT STS HE HAD CERVICAL FUSION SX ON 3/13, BUT HIS PAIN IS THE SAME. HE WENT TO HIS F/U APPT ON Tuesday,A ND WAS TOLD THE PAIN IS NORMAL. DENIES INJURY. PT ALSO C/O RIGHT EYE DRAINAGE AND PAIN X2 WEEKS. DENIES VISUAL CHANGES. PT ALSO C/O LEFT UPPER TOOTH PAIN AND SWELLING X3 DAYS. DENIES FEVER.

## 2016-08-27 NOTE — ED Provider Notes (Signed)
[facility]-EMER[facility] DEPT Provider Note   CSN: [id number] Arrival date & time: 08/26/16  2214     History   Chief Complaint Chief Complaint  Patient presents with  . Eye Drainage    RIGHT  . Oral Swelling  . Neck Pain    HPI Luis Graves is a 31 y.o. male.  The history is provided by the patient. No language interpreter was used.  Neck Pain   This is a chronic problem. The problem has been gradually worsening. The pain is associated with nothing. There has been no fever. The pain is moderate. He has tried nothing for the symptoms.  Pt has pain in his neck after a cervical fusion.  Pt complains of swelling and pain from a tooth left side of his face.  Pt reports drainage from left eye.  Pt thinks he has pink eye  History reviewed. No pertinent past medical history.  There are no active problems to display for this patient.   Past Surgical History:  Procedure Laterality Date  . CERVICAL FUSION  08/10/2016  . HERNIA REPAIR         Home Medications    Prior to Admission medications   Medication Sig Start Date End Date Taking? Authorizing Provider  amoxicillin (AMOXIL) 500 MG capsule Take 1 capsule (500 mg total) by mouth 3 (three) times daily. 08/26/16   Elson Areas, PA-C  benzonatate (TESSALON) 100 MG capsule Take 1 capsule (100 mg total) by mouth every 8 (eight) hours. 06/26/15   Ace Gins Sam, PA-C  guaiFENesin (MUCINEX) 600 MG 12 hr tablet Take 1 tablet (600 mg total) by mouth 2 (two) times daily as needed for to loosen phlegm. 06/26/15   Carlene Coria, PA-C  HYDROcodone-acetaminophen (NORCO/VICODIN) 5-325 MG tablet Take 2 tablets by mouth every 4 (four) hours as needed. 08/26/16   Elson Areas, PA-C  ibuprofen (ADVIL,MOTRIN) 800 MG tablet Take 1 tablet (800 mg total) by mouth every 6 (six) hours as needed. 07/17/16   Audry Pili, PA-C  ondansetron (ZOFRAN ODT) 4 MG disintegrating tablet  ODT q4 hours prn nausea/vomit 04/25/15   Mirian Mo, MD  ondansetron (ZOFRAN  ODT) 4 MG disintegrating tablet Take 1 tablet (4 mg total) by mouth every 8 (eight) hours as needed for nausea or vomiting. 06/26/15   Ace Gins Sam, PA-C  tobramycin (TOBREX) 0.3 % ophthalmic solution Place 2 drops into the right eye every 4 (four) hours. 08/26/16   Elson Areas, PA-C  traMADol (ULTRAM) 50 MG tablet Take 1 tablet (50 mg total) by mouth every 6 (six) hours as needed. 07/17/16   Audry Pili, PA-C    Family History History reviewed. No pertinent family history.  Social History Social History  Substance Use Topics  . Smoking status: Former Games developer  . Smokeless tobacco: Never Used  . Alcohol use Yes     Comment: occ     Allergies   Patient has no known allergies.   Review of Systems Review of Systems  Musculoskeletal: Positive for neck pain.  All other systems reviewed and are negative.    Physical Exam Updated Vital Signs BP (!) 144/90 (BP Location: Left Arm)   Pulse 91   Temp 99.1 F (37.3 C) (Oral)   Resp 19   Ht  (1.854 m)   Wt 93 kg   SpO2 98%   BMI 27.05 kg/m   Physical Exam  Constitutional: He appears well-developed and well-nourished.  HENT:  Head: Normocephalic and atraumatic.  Mouth/Throat: Oropharynx is clear and moist.  Swollen left gumline  Eyes: EOM are normal. Pupils are equal, round, and reactive to light. Right eye exhibits discharge.  Neck: Neck supple.  Cardiovascular: Normal rate and regular rhythm.   No murmur heard. Pulmonary/Chest: Effort normal and breath sounds normal. No respiratory distress.  Abdominal: Soft. There is no tenderness.  Musculoskeletal: He exhibits no edema.  Neurological: He is alert.  Skin: Skin is warm and dry.  Psychiatric: He has a normal mood and affect.  Nursing note and vitals reviewed.    ED Treatments / Results  Labs (all labs ordered are listed, but only abnormal results are displayed) Labs Reviewed - No data to display  EKG  EKG Interpretation None       Radiology No results  found.  Procedures Procedures (including critical care time)  Medications Ordered in ED Medications  HYDROcodone-acetaminophen (NORCO/VICODIN) 5-325 MG per tablet 2 tablet (2 tablets Oral Given 08/26/16 2346)     Initial Impression / Assessment and Plan / ED Course  I have reviewed the triage vital signs and the nursing notes.  Pertinent labs & imaging results that were available during my care of the patient were reviewed by me and considered in my medical decision making (see chart for details).     Meds ordered this encounter  Medications  . amoxicillin (AMOXIL) 500 MG capsule    Sig: Take 1 capsule (500 mg total) by mouth 3 (three) times daily.    Dispense:  21 capsule    Refill:  0    Order Specific Question:   Supervising Provider    Answer:   MILLER, BRIAN [[id number]]  . HYDROcodone-acetaminophen (NORCO/VICODIN) 5-325 MG tablet    Sig: Take 2 tablets by mouth every 4 (four) hours as needed.    Dispense:  10 tablet    Refill:  0    Order Specific Question:   Supervising Provider    Answer:   Hyacinth Meeker, BRIAN [[id number]]  . tobramycin (TOBREX) 0.3 % ophthalmic solution    Sig: Place 2 drops into the right eye every 4 (four) hours.    Dispense:  5 mL    Refill:  0    Order Specific Question:   Supervising Provider    Answer:   MILLER, BRIAN [[id number]]  . HYDROcodone-acetaminophen (NORCO/VICODIN) 5-325 MG per tablet 2 tablet     Final Clinical Impressions(s) / ED Diagnoses   Final diagnoses:  Neck pain  Dental abscess  Acute bacterial conjunctivitis of right eye    New Prescriptions Discharge Medication List as of 08/26/2016 11:33 PM    START taking these medications   Details  amoxicillin (AMOXIL) 500 MG capsule Take 1 capsule (500 mg total) by mouth 3 (three) times daily., Starting Thu 08/26/2016, Print    HYDROcodone-acetaminophen (NORCO/VICODIN) 5-325 MG tablet Take 2 tablets by mouth every 4 (four) hours as needed., Starting Thu 08/26/2016, Print    tobramycin (TOBREX)  0.3 % ophthalmic solution Place 2 drops into the right eye every 4 (four) hours., Starting Thu 08/26/2016, Print      An After Visit Summary was printed and given to the patient.    Lonia Skinner Woodman, PA-C 08/27/16 4098    Pricilla Loveless, MD 08/28/16 [phone]

## 2017-08-29 IMAGING — DX DG FOOT COMPLETE 3+V*L*
3 series · 3 of 3 positions shown · non-contrast
Comparison: None.

CLINICAL DATA: Laceration to great toe

EXAM:
LEFT FOOT - COMPLETE 3+ VIEW

[foot ap]
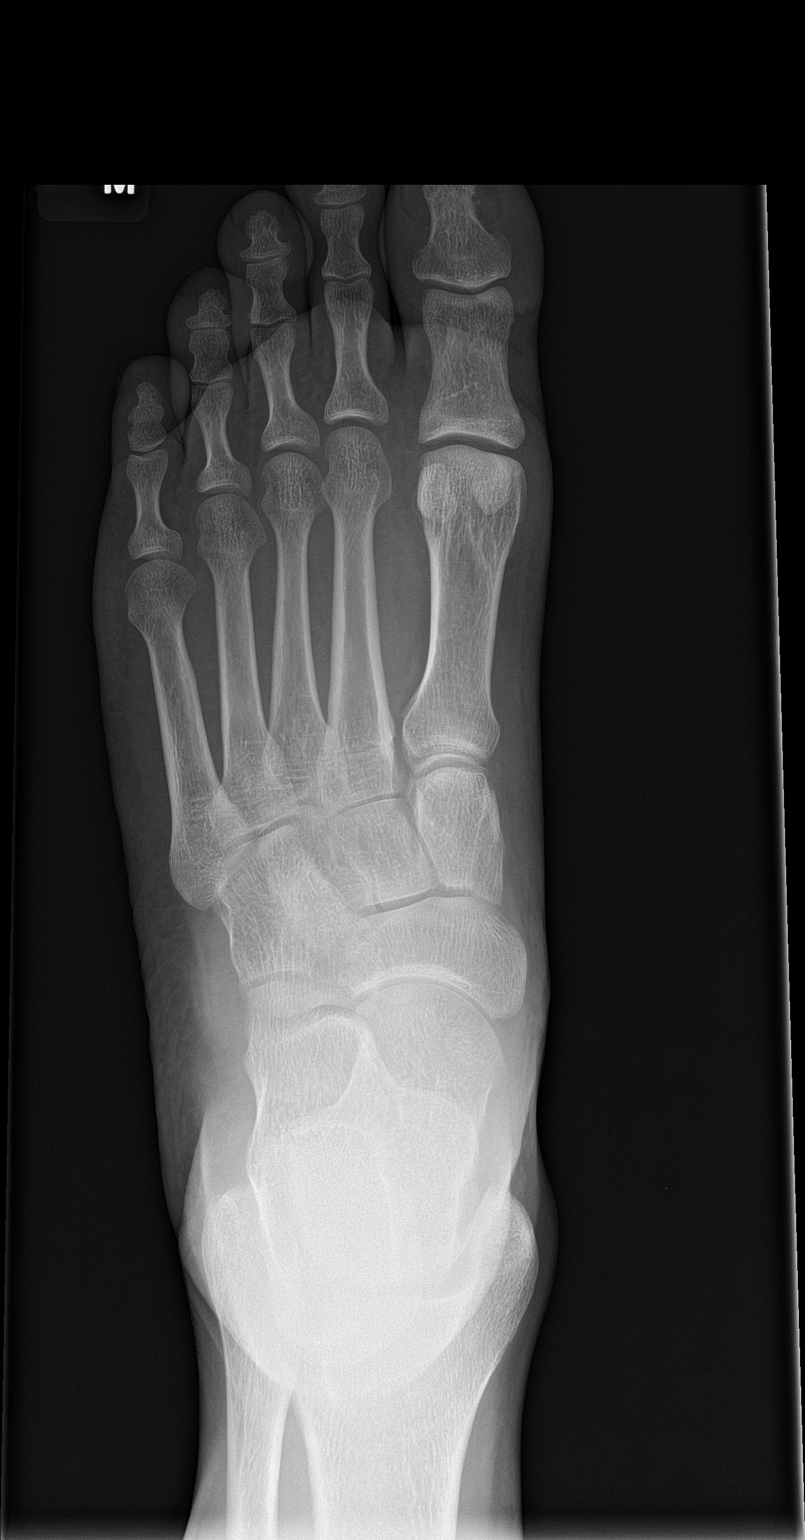

[foot obl]
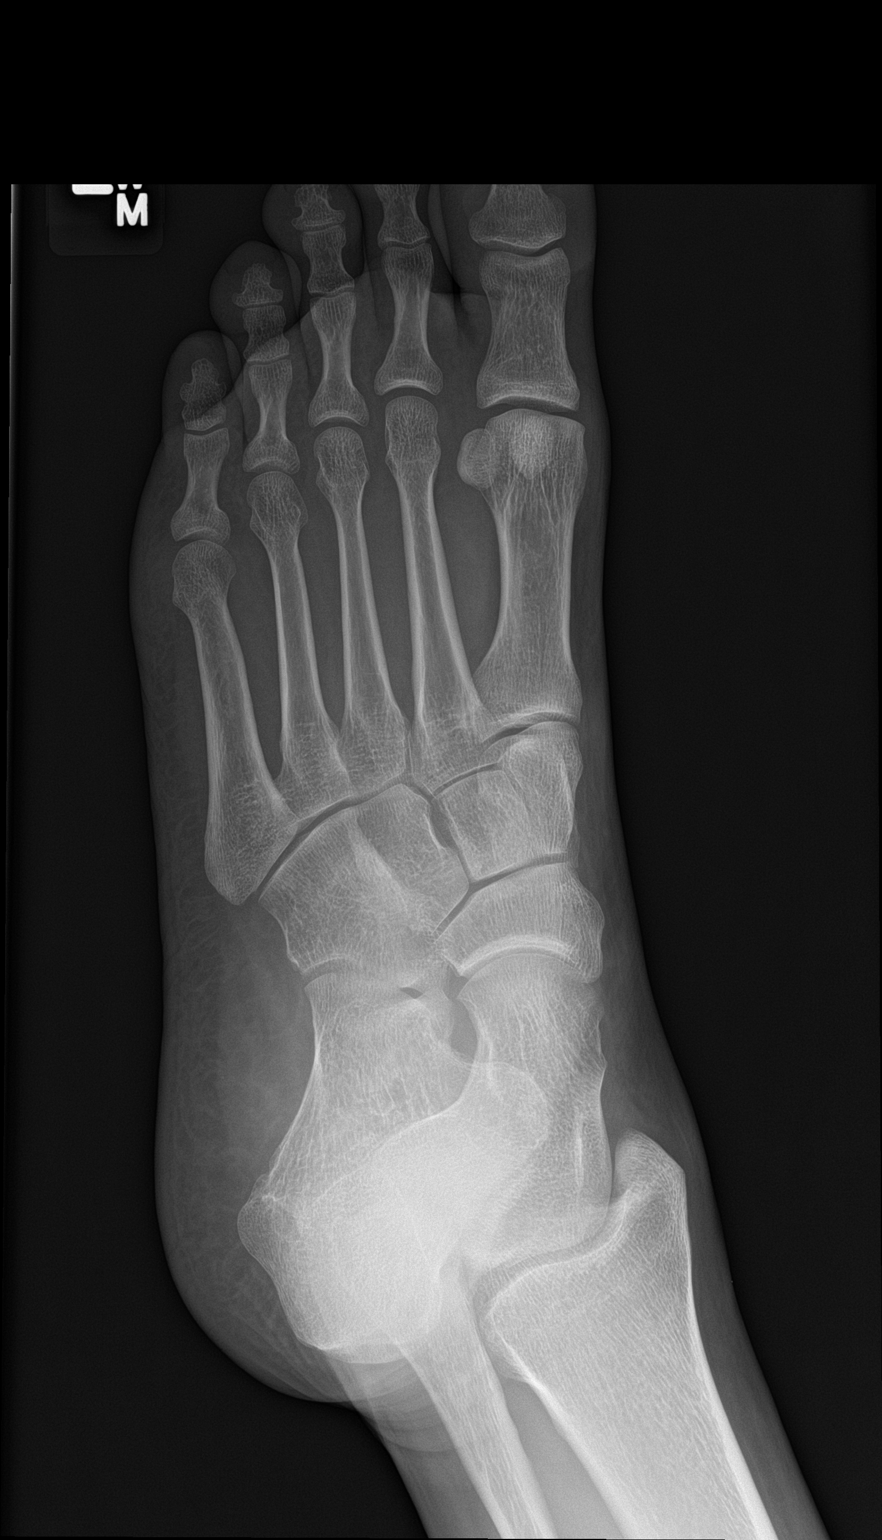

[foot lat]
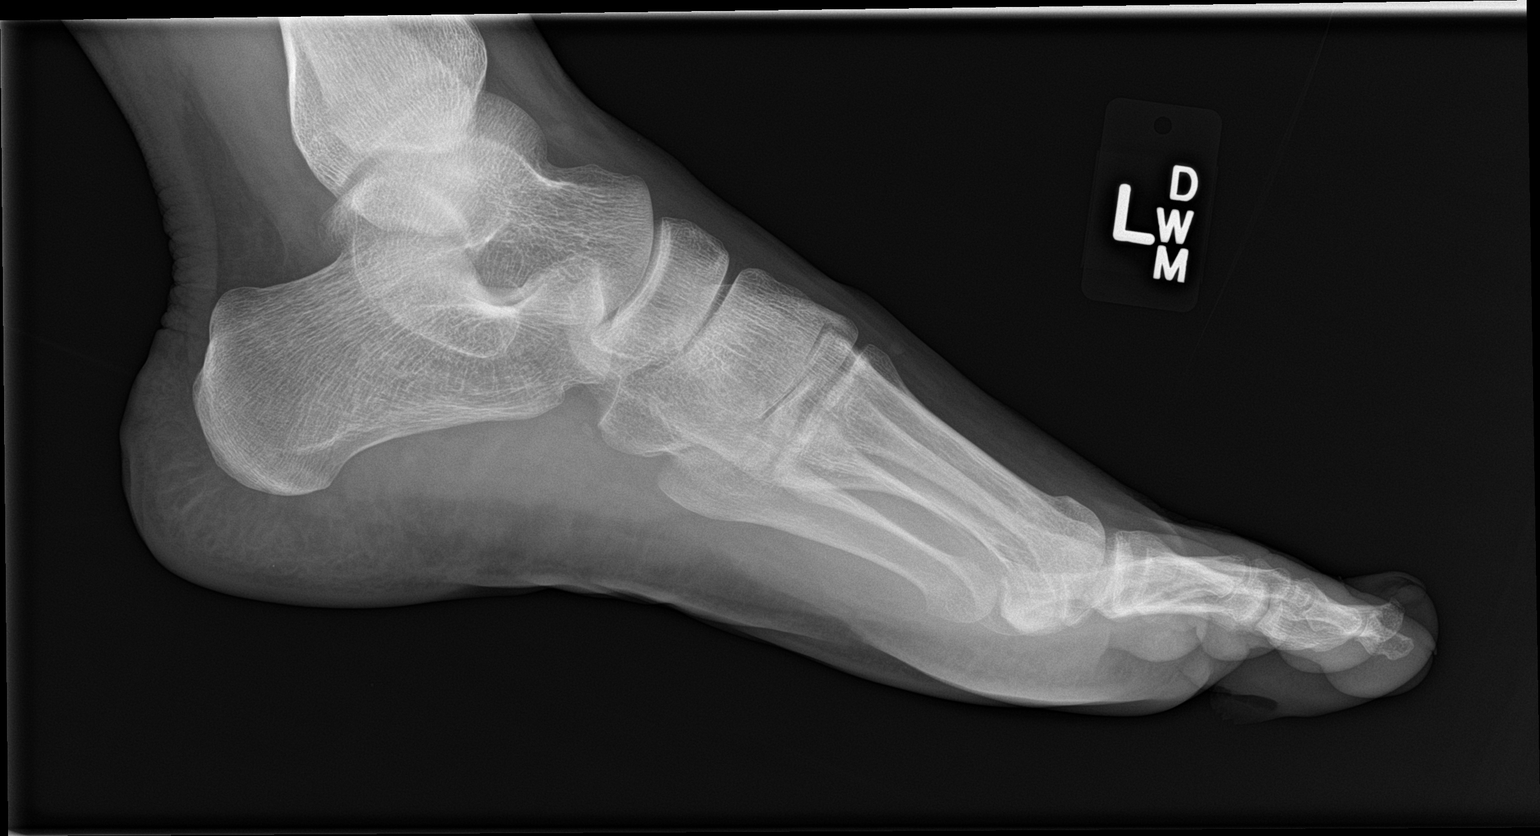

[3 of 3 positions shown; findings below may reference images not displayed]

FINDINGS: No fracture dislocation of the first digit. No radiodense foreign
body.
IMPRESSION: No fracture or foreign body.

## 2018-01-19 IMAGING — DX DG CHEST 2V
2 series · 2 of 2 positions shown · non-contrast
Comparison: None.

CLINICAL DATA: Fever, cough, chills and body aches for 2 days.
Initial encounter.

EXAM:
CHEST  2 VIEW

[chest pa]
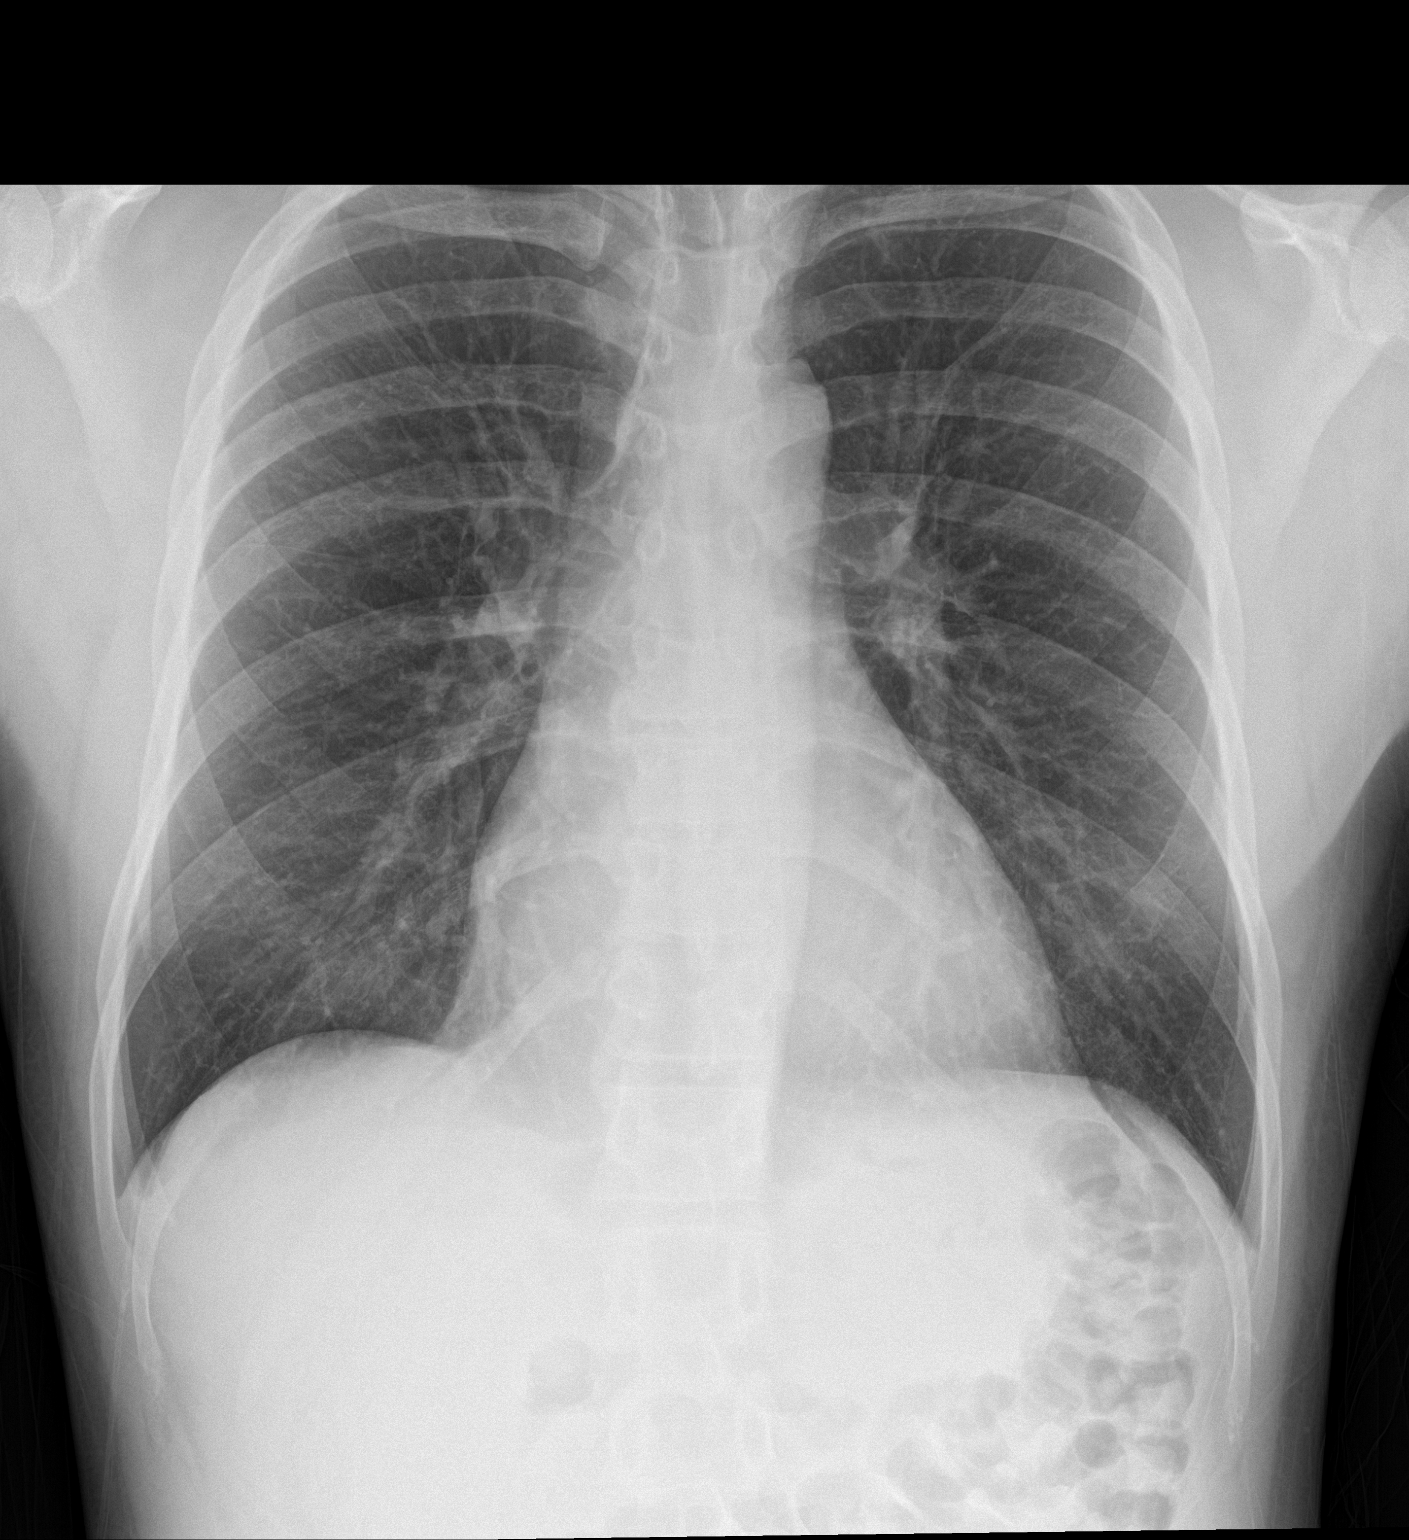

[chest lat]
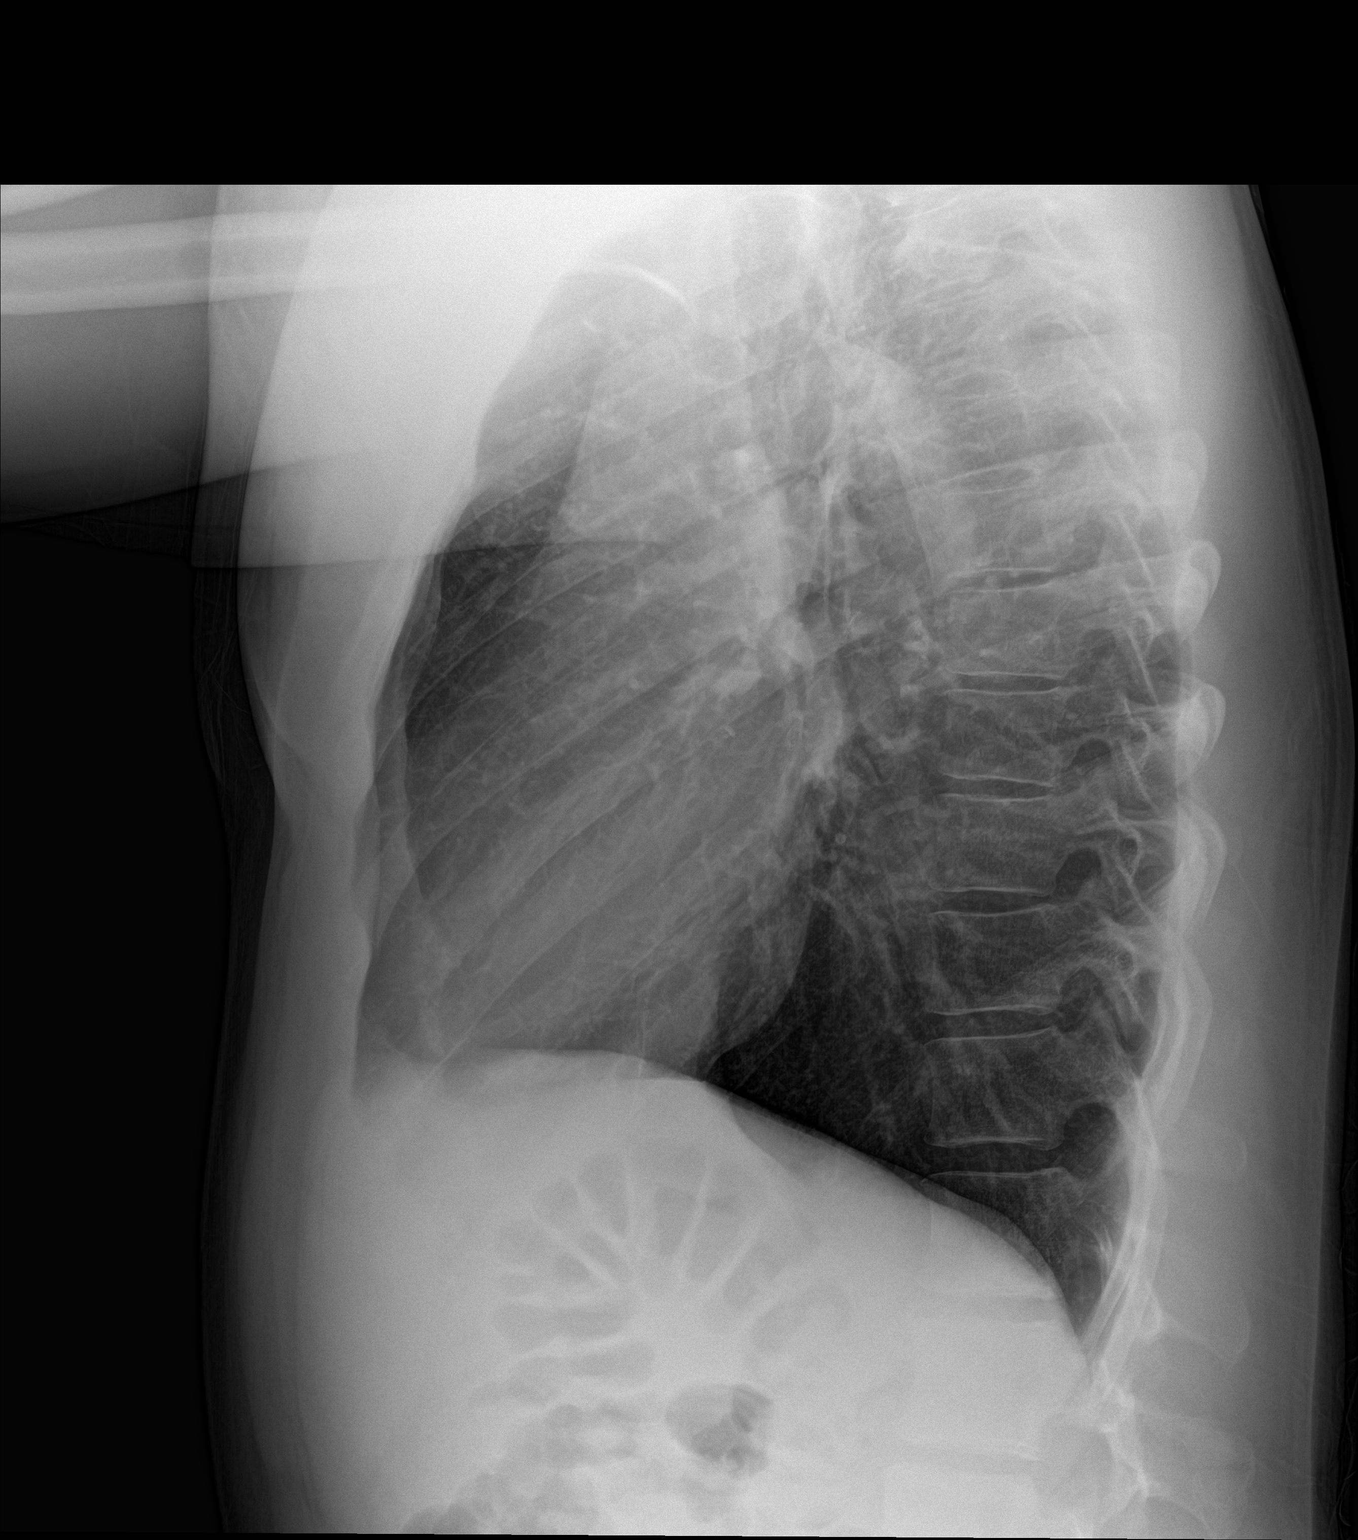

[2 of 2 positions shown; findings below may reference images not displayed]

FINDINGS: The lungs are clear. Heart size is normal. There is no pneumothorax
or pleural effusion. No focal bony abnormality is identified.
IMPRESSION: Negative chest.

## 2018-07-20 ENCOUNTER — Encounter (HOSPITAL_COMMUNITY): Payer: Self-pay

## 2018-07-20 ENCOUNTER — Emergency Department (HOSPITAL_COMMUNITY)
Admission: EM | Admit: 2018-07-20 | Discharge: 2018-07-20 | Disposition: A | Discharge status: Home / Self Care | Payer: PRIVATE HEALTH INSURANCE | Attending: Emergency Medicine | Admitting: Emergency Medicine

## 2018-07-20 ENCOUNTER — Other Ambulatory Visit: Payer: Self-pay

## 2018-07-20 DIAGNOSIS — K0889 Other specified disorders of teeth and supporting structures: Secondary | ICD-10-CM | POA: Insufficient documentation

## 2018-07-20 MED ORDER — AMOXICILLIN 400 MG/5ML PO SUSR: 400.0000 mg | Freq: Three times a day (TID) | ORAL | 0 refills | Status: AC

## 2018-07-20 MED ORDER — OXYCODONE-ACETAMINOPHEN 5-325 MG PO TABS
1.0000 | ORAL_TABLET | Freq: Once | ORAL | Status: AC
  Administered 2018-07-20: 1 via ORAL
  Filled 2018-07-20: qty 1

## 2018-07-20 MED ORDER — IBUPROFEN 200 MG PO TABS
600.0000 mg | ORAL_TABLET | Freq: Once | ORAL | Status: AC
  Administered 2018-07-20: 600 mg via ORAL
  Filled 2018-07-20: qty 3

## 2018-07-20 MED ORDER — AMOXICILLIN 500 MG PO CAPS
500.0000 mg | ORAL_CAPSULE | Freq: Once | ORAL | Status: AC
  Administered 2018-07-20: 500 mg via ORAL
  Filled 2018-07-20: qty 1

## 2018-07-20 NOTE — Discharge Instructions (Addendum)
Today you received medications that may make you sleepy or impair your ability to make decisions.  For the next 24 hours please do not drive, operate heavy machinery, care for a small child with out another adult present, or perform any activities that may cause harm to you or someone else if you were to fall asleep or be impaired.   You may have diarrhea from the antibiotics.  It is very important that you continue to take the antibiotics even if you get diarrhea unless a medical professional tells you that you may stop taking them.  If you stop too early the bacteria you are being treated for will become stronger and you may need different, more powerful antibiotics that have more side effects and worsening diarrhea.  Please stay well hydrated and consider probiotics as they may decrease the severity of your diarrhea.    Please take Ibuprofen (Advil, motrin) and Tylenol (acetaminophen) to relieve your pain.  You may take up to 600 MG (3 pills) of normal strength ibuprofen every 8 hours as needed.  In between doses of ibuprofen you make take tylenol, up to 1,000 mg (two extra strength pills).  Do not take more than 3,000 mg tylenol in a 24 hour period.  Please check all medication labels as many medications such as pain and cold medications may contain tylenol.  Do not drink alcohol while taking these medications.  Do not take other NSAID'S while taking ibuprofen (such as aleve or naproxen).  Please take ibuprofen with food to decrease stomach upset.

## 2018-07-20 NOTE — ED Provider Notes (Signed)
[facility]-EMERGENCY DEPT Provider Note   CSN: [id number] Arrival date & time: 07/20/18  2102    History   Chief Complaint Chief Complaint  Patient presents with  . Dental Pain    HPI Luis Graves is a 33 y.o. male who presents today for evaluation of left-sided dental pain.  He states that a few months ago he had a filling fall out and he has not followed up to get it fixed.  He denies any fevers.  He reports he has been trying aspirin, BC powder, and leftover tramadol without significant relief.  He denies any allergies.  No nausea or vomiting.  No fevers.  He has not attempted to contact the dentist.     HPI  History reviewed. No pertinent past medical history.  There are no active problems to display for this patient.   Past Surgical History:  Procedure Laterality Date  . CERVICAL FUSION  08/10/2016  . HERNIA REPAIR          Home Medications    Prior to Admission medications   Medication Sig Start Date End Date Taking? Authorizing Provider  amoxicillin (AMOXIL) 400 MG/5ML suspension Take 5 mLs (400 mg total) by mouth 3 (three) times daily for 7 days. 07/20/18 07/27/18  Cristina Gong, PA-C  benzonatate (TESSALON) 100 MG capsule Take 1 capsule (100 mg total) by mouth every 8 (eight) hours. 06/26/15   Sam, Ace Gins, PA-C  guaiFENesin (MUCINEX) 600 MG 12 hr tablet Take 1 tablet (600 mg total) by mouth 2 (two) times daily as needed for to loosen phlegm. 06/26/15   Sam, Ace Gins, PA-C  HYDROcodone-acetaminophen (NORCO/VICODIN) 5-325 MG tablet Take 2 tablets by mouth every 4 (four) hours as needed. 08/26/16   Elson Areas, PA-C  ibuprofen (ADVIL,MOTRIN) 800 MG tablet Take 1 tablet (800 mg total) by mouth every 6 (six) hours as needed. 07/17/16   Audry Pili, PA-C  ondansetron (ZOFRAN ODT) 4 MG disintegrating tablet 4mg  ODT q4 hours prn nausea/vomit 04/25/15   Mirian Mo, MD  ondansetron (ZOFRAN ODT) 4 MG disintegrating tablet Take 1 tablet (4  mg total) by mouth every 8 (eight) hours as needed for nausea or vomiting. 06/26/15   Sam, Ace Gins, PA-C  tobramycin (TOBREX) 0.3 % ophthalmic solution Place 2 drops into the right eye every 4 (four) hours. 08/26/16   Elson Areas, PA-C  traMADol (ULTRAM) 50 MG tablet Take 1 tablet (50 mg total) by mouth every 6 (six) hours as needed. 07/17/16   Audry Pili, PA-C    Family History No family history on file.  Social History Social History   Tobacco Use  . Smoking status: Former Games developer  . Smokeless tobacco: Never Used  Substance Use Topics  . Alcohol use: Yes    Comment: occ  . Drug use: No     Allergies   Patient has no known allergies.   Review of Systems Review of Systems  Constitutional: Negative for chills and fever.  HENT: Positive for dental problem and facial swelling. Negative for congestion, drooling, sinus pain and trouble swallowing.   Respiratory: Negative for stridor.   Gastrointestinal: Negative for abdominal pain and nausea.  All other systems reviewed and are negative.    Physical Exam Updated Vital Signs BP 132/80 (BP Location: Left Arm)   Pulse (!) 56   Temp 98 F (36.7 C) (Oral)   Resp 16   Ht 6' (1.829 m)   Wt 93 kg   SpO2 98%  BMI 27.80 kg/m   Physical Exam Vitals signs and nursing note reviewed.  Constitutional:      General: He is not in acute distress. HENT:     Head: Normocephalic and atraumatic.     Jaw: There is normal jaw occlusion. No trismus, swelling, pain on movement or malocclusion.     Salivary Glands: Right salivary gland is not diffusely enlarged or tender. Left salivary gland is not diffusely enlarged or tender.     Comments: Mild edema of the left sided lower jaw, edema does not extend below mandible.  No elevation of the floor of the mouth.  No significant submandibular swelling.  Dentition is in generally poor state.  There is no intraoral edema. On the left mandible there is what appears to be a premolar with a missing  filling.  Teeth are in generally poor state with missing teeth.    Mouth/Throat:     Mouth: Mucous membranes are moist.     Palate: No mass.     Pharynx: Oropharynx is clear. Uvula midline. No oropharyngeal exudate.  Neck:     Musculoskeletal: Normal range of motion and neck supple.  Lymphadenopathy:     Cervical: No cervical adenopathy.  Skin:    General: Skin is warm.  Neurological:     General: No focal deficit present.     Mental Status: He is alert. Mental status is at baseline.  Psychiatric:        Mood and Affect: Mood normal.        Behavior: Behavior normal.      ED Treatments / Results  Labs (all labs ordered are listed, but only abnormal results are displayed) Labs Reviewed - No data to display  EKG None  Radiology No results found.  Procedures Procedures (including critical care time)  Medications Ordered in ED Medications  oxyCODONE-acetaminophen (PERCOCET/ROXICET) 5-325 MG per tablet 1 tablet (1 tablet Oral Given 07/20/18 2201)  amoxicillin (AMOXIL) capsule 500 mg (500 mg Oral Given 07/20/18 2201)  ibuprofen (ADVIL,MOTRIN) tablet 600 mg (600 mg Oral Given 07/20/18 2201)     Initial Impression / Assessment and Plan / ED Course  I have reviewed the triage vital signs and the nursing notes.  Pertinent labs & imaging results that were available during my care of the patient were reviewed by me and considered in my medical decision making (see chart for details).       Patient with toothache.  No gross abscess.  Exam unconcerning for Ludwig's angina or spread of infection.  Will treat with penicillin and pain medicine.  Urged patient to follow-up with dentist.     Final Clinical Impressions(s) / ED Diagnoses   Final diagnoses:  Pain, dental    ED Discharge Orders         Ordered    amoxicillin (AMOXIL) 400 MG/5ML suspension  3 times daily     07/20/18 2138           Cristina Gong, [place] 07/21/18 Mike Gip    Cathren Laine, MD 07/21/18  [phone]

## 2018-07-20 NOTE — ED Triage Notes (Signed)
Pt arrives by POV due to left sided dental pain x 1 week. Pt states he has not called his dentist to follow up. Pain 10/10.

## 2018-09-14 ENCOUNTER — Other Ambulatory Visit: Payer: Self-pay

## 2018-09-14 ENCOUNTER — Encounter (HOSPITAL_COMMUNITY): Payer: Self-pay | Admitting: Emergency Medicine

## 2018-09-14 ENCOUNTER — Ambulatory Visit (HOSPITAL_COMMUNITY)
Admission: EM | Admit: 2018-09-14 | Discharge: 2018-09-14 | Disposition: A | Discharge status: Home / Self Care | Payer: Self-pay | Attending: Internal Medicine | Admitting: Internal Medicine

## 2018-09-14 DIAGNOSIS — K047 Periapical abscess without sinus: Secondary | ICD-10-CM

## 2018-09-14 DIAGNOSIS — K0889 Other specified disorders of teeth and supporting structures: Secondary | ICD-10-CM

## 2018-09-14 MED ORDER — KETOROLAC TROMETHAMINE 60 MG/2ML IM SOLN
INTRAMUSCULAR | Status: AC
  Filled 2018-09-14: qty 2

## 2018-09-14 MED ORDER — AMOXICILLIN-POT CLAVULANATE 875-125 MG PO TABS: 1.0000 | ORAL_TABLET | Freq: Two times a day (BID) | ORAL | 0 refills | Status: AC

## 2018-09-14 MED ORDER — HYDROCODONE-ACETAMINOPHEN 5-325 MG PO TABS: 1.0000 | ORAL_TABLET | Freq: Four times a day (QID) | ORAL | 0 refills | Status: AC | PRN

## 2018-09-14 MED ORDER — KETOROLAC TROMETHAMINE 60 MG/2ML IM SOLN
60.0000 mg | Freq: Once | INTRAMUSCULAR | Status: AC
  Administered 2018-09-14: 60 mg via INTRAMUSCULAR

## 2018-09-14 MED ORDER — IBUPROFEN 800 MG PO TABS: 800.0000 mg | ORAL_TABLET | Freq: Three times a day (TID) | ORAL | 0 refills | Status: AC

## 2018-09-14 NOTE — ED Triage Notes (Signed)
Dental pain and swelling for 2-3 days, right side of face

## 2018-09-14 NOTE — Discharge Instructions (Addendum)
Toradol shot given in office Continue with soft diet and maintain oral hygiene Ibuprofen 800 mg prescribed.  Take twice daily for pain.  Avoid taking with other antiinflammatories, as this may cause GI upset and/or bleed Augmentin prescribed for dental abscess/ infection.  Take as directed and to completion Norco paper prescription attached.  Use as needed for severe break through pain Follow up with dentist as soon as possible for further evaluation and treatment (I have attached information for local dentist in the area) Go to the ED if you have any new or worsening symptoms such as fever, chills, difficulty swallowing, painful swallowing, worsening oral or neck swelling, nausea, vomiting, chest pain, shortness of breath, trouble with breathing, etc..Marland Kitchen

## 2018-09-14 NOTE — ED Provider Notes (Signed)
[facility] CARE CENTER   [id number] 09/14/18 Arrival Time: 1850  CC: DENTAL PAIN  SUBJECTIVE:  Luis Graves is a 33 y.o. male who presents with dental pain x 2-3 days.  Denies a precipitating event or trauma.  Localizes pain to RT lower gum line.  Has tried OTC analgesics with temporary relief.  Worse with chewing.  Does not see a dentist regularly.  Reports similar symptoms in the past that improved with antibiotic. Complains associated gum and cheek swelling.  Denies fever, chills, dysphagia, odynophagia, neck swelling, nausea, vomiting, chest pain, SOB, dyspnea.    ROS: As per HPI.  History reviewed. No pertinent past medical history. Past Surgical History:  Procedure Laterality Date  . CERVICAL FUSION  08/10/2016  . HERNIA REPAIR     No Known Allergies No current facility-administered medications on file prior to encounter.    No current outpatient medications on file prior to encounter.   Social History   Socioeconomic History  . Marital status: Single    Spouse name: Not on file  . Number of children: Not on file  . Years of education: Not on file  . Highest education level: Not on file  Occupational History  . Not on file  Social Needs  . Financial resource strain: Not on file  . Food insecurity:    Worry: Not on file    Inability: Not on file  . Transportation needs:    Medical: Not on file    Non-medical: Not on file  Tobacco Use  . Smoking status: Former Games developer  . Smokeless tobacco: Never Used  Substance and Sexual Activity  . Alcohol use: Yes    Comment: occ  . Drug use: No  . Sexual activity: Not on file  Lifestyle  . Physical activity:    Days per week: Not on file    Minutes per session: Not on file  . Stress: Not on file  Relationships  . Social connections:    Talks on phone: Not on file    Gets together: Not on file    Attends religious service: Not on file    Active member of club or organization: Not on file    Attends meetings of  clubs or organizations: Not on file    Relationship status: Not on file  . Intimate partner violence:    Fear of current or ex partner: Not on file    Emotionally abused: Not on file    Physically abused: Not on file    Forced sexual activity: Not on file  Other Topics Concern  . Not on file  Social History Narrative  . Not on file   Family History  Problem Relation Age of Onset  . Healthy Mother   . Healthy Father     OBJECTIVE:  Vitals:   09/14/18 1923  BP: 122/80  Pulse: 64  Resp: 18  Temp: 98.1 F (36.7 C)  TempSrc: Oral  SpO2: 97%    General appearance: alert; no distress HENT: normocephalic; atraumatic; dentition: poor; edentulous over right lower gums with areas of fluctuance, TTP; mild swelling over RT cheek; oropharynx clear, tonsils not enlarged or erythematous, uvula midline; tolerating secretions without difficulty Neck: supple without LAD Lungs: normal respirations Skin: warm and dry Psychological: alert and cooperative; normal mood and affect  ASSESSMENT & PLAN:  1. Pain, dental   2. Dental abscess     Meds ordered this encounter  Medications  . ketorolac (TORADOL) injection 60 mg  . ibuprofen (ADVIL) 800  MG tablet    Sig: Take 1 tablet (800 mg total) by mouth 3 (three) times daily.    Dispense:  21 tablet    Refill:  0    Order Specific Question:   Supervising Provider    Answer:   Eustace MooreNELSON, YVONNE SUE [[id number]]  . HYDROcodone-acetaminophen (NORCO/VICODIN) 5-325 MG tablet    Sig: Take 1 tablet by mouth every 6 (six) hours as needed for severe pain.    Dispense:  5 tablet    Refill:  0    Order Specific Question:   Supervising Provider    Answer:   Eustace MooreNELSON, YVONNE SUE [[id number]]  . amoxicillin-clavulanate (AUGMENTIN) 875-125 MG tablet    Sig: Take 1 tablet by mouth every 12 (twelve) hours for 10 days.    Dispense:  20 tablet    Refill:  0    Order Specific Question:   Supervising Provider    Answer:   Eustace MooreELSON, YVONNE SUE [[id number]]   Toradol  shot given in office Continue with soft diet and maintain oral hygiene Ibuprofen 800 mg prescribed.  Take twice daily for pain.  Avoid taking with other antiinflammatories, as this may cause GI upset and/or bleed Augmentin prescribed for dental abscess/ infection.  Take as directed and to completion Norco paper prescription attached.  Use as needed for severe break through pain Follow up with dentist as soon as possible for further evaluation and treatment (I have attached information for local dentist in the area) Go to the ED if you have any new or worsening symptoms such as fever, chills, difficulty swallowing, painful swallowing, worsening oral or neck swelling, nausea, vomiting, chest pain, shortness of breath, trouble with breathing, etc...  Reviewed expectations re: course of current medical issues. Questions answered. Outlined signs and symptoms indicating need for more acute intervention. Patient verbalized understanding. After Visit Summary given.   Rennis HardingWurst, Moniqua Engebretsen, PA-C 09/14/18 1953

## 2018-09-29 ENCOUNTER — Encounter (HOSPITAL_COMMUNITY): Payer: Self-pay | Admitting: Emergency Medicine

## 2018-09-29 ENCOUNTER — Other Ambulatory Visit: Payer: Self-pay

## 2018-09-29 ENCOUNTER — Emergency Department (HOSPITAL_COMMUNITY)
Admission: EM | Admit: 2018-09-29 | Discharge: 2018-09-29 | Disposition: A | Discharge status: Home / Self Care | Payer: Commercial Managed Care - PPO | Attending: Emergency Medicine | Admitting: Emergency Medicine

## 2018-09-29 DIAGNOSIS — K0889 Other specified disorders of teeth and supporting structures: Secondary | ICD-10-CM | POA: Diagnosis not present

## 2018-09-29 MED ORDER — ACETAMINOPHEN 325 MG PO TABS: 650.0000 mg | ORAL_TABLET | Freq: Once | ORAL | Status: DC

## 2018-09-29 NOTE — Discharge Instructions (Signed)

## 2018-09-29 NOTE — ED Notes (Signed)
Patient verbalizes understanding of discharge instructions. Opportunity for questioning and answers were provided. Armband removed by staff, pt discharged from ED. Ambulated out to lobby  

## 2018-09-29 NOTE — ED Provider Notes (Signed)
[facility] EMERGENCY DEPARTMENT Provider Note   CSN: [id number] Arrival date & time: 09/29/18  0219    History   Chief Complaint Chief Complaint  Patient presents with  . Dental Pain    HPI Luis Graves is a 33 y.o. male who presents today for evaluation of dental pain.  He reports that he has had pain on the left upper and lower dental line for the past 3 days that is been gradually worsening.  He was recently seen here on 4/16 and treated with Augmentin for right-sided dental pain.  He reports that he has been taking 800 mg ibuprofen without significant relief.  He states that it feels like it is a sharp nerve pain that radiates from his lower jaw, into his upper jaw and up towards his ear.  He denies any recent trauma.  He has not been able to see a dentist since he was last seen.    HPI  History reviewed. No pertinent past medical history.  There are no active problems to display for this patient.   Past Surgical History:  Procedure Laterality Date  . CERVICAL FUSION  08/10/2016  . HERNIA REPAIR          Home Medications    Prior to Admission medications   Medication Sig Start Date End Date Taking? Authorizing Provider  HYDROcodone-acetaminophen (NORCO/VICODIN) 5-325 MG tablet Take 1 tablet by mouth every 6 (six) hours as needed for severe pain. 09/14/18   Wurst, [place], PA-C  ibuprofen (ADVIL) 800 MG tablet Take 1 tablet (800 mg total) by mouth 3 (three) times daily. 09/14/18   Rennis Harding, PA-C    Family History Family History  Problem Relation Age of Onset  . Healthy Mother   . Healthy Father     Social History Social History   Tobacco Use  . Smoking status: Former Games developer  . Smokeless tobacco: Never Used  Substance Use Topics  . Alcohol use: Yes    Comment: occ  . Drug use: No     Allergies   Patient has no known allergies.   Review of Systems Review of Systems  Constitutional: Negative for chills and fever.  HENT:  Positive for dental problem. Negative for congestion, drooling, ear discharge, trouble swallowing and voice change.   Gastrointestinal: Negative for abdominal pain.  Neurological: Negative for weakness and headaches.  All other systems reviewed and are negative.    Physical Exam Updated Vital Signs BP 132/75   Pulse 64   Temp 98.9 F (37.2 C) (Oral)   Resp 16   Ht 6' (1.829 m)   Wt 93 kg   SpO2 97%   BMI 27.80 kg/m   Physical Exam Vitals signs and nursing note reviewed.  Constitutional:      General: He is not in acute distress.    Appearance: He is not toxic-appearing.  HENT:     Head: Normocephalic and atraumatic.     Right Ear: Tympanic membrane, ear canal and external ear normal.     Left Ear: Tympanic membrane, ear canal and external ear normal.     Nose: Nose normal.     Mouth/Throat:     Mouth: Mucous membranes are moist.     Pharynx: No oropharyngeal exudate or posterior oropharyngeal erythema.     Comments: Teeth are in generally poor state.  There are multiple missing teeth.  The left mandibular premolars have dental fillings with the second 1 being cracked.  There is no abnormal intraoral  swelling.  No evidence of dental abscess.  Uvula is midline without deviation.   Eyes:     Conjunctiva/sclera: Conjunctivae normal.  Neck:     Musculoskeletal: Normal range of motion. No neck rigidity.  Cardiovascular:     Rate and Rhythm: Normal rate.  Skin:    General: Skin is warm and dry.  Neurological:     General: No focal deficit present.     Mental Status: He is alert.  Psychiatric:        Mood and Affect: Mood normal.        Behavior: Behavior normal.      ED Treatments / Results  Labs (all labs ordered are listed, but only abnormal results are displayed) Labs Reviewed - No data to display  EKG None  Radiology No results found.  Procedures Procedures (including critical care time)  Medications Ordered in ED Medications  acetaminophen (TYLENOL)  tablet 650 mg (has no administration in time range)     Initial Impression / Assessment and Plan / ED Course  I have reviewed the triage vital signs and the nursing notes.  Pertinent labs & imaging results that were available during my care of the patient were reviewed by me and considered in my medical decision making (see chart for details).       Patient presents today for evaluation of dental pain.  His pain started 3 days ago while he was finishing a course of Augmentin for dental pain on the right side.  On exam he has a broken tooth with a dental filling overlying the crack.  Given that his pain started while he was taking appropriate antibiotics I suspect that his pain is not related to infection, rather related to nerve pain from exposed nerve with the cracked tooth.  Recommended ibuprofen, Tylenol, Orajel, soft diet.  We discussed the importance of dental follow-up and he states his understanding.  At this time I do not feel like additional antibiotics are indicated and would more likely cause him harm than benefit.  We discussed that if he does not get this treated by a dentist it may become infected in the future.  I do not feel that narcotics are appropriate for his pain currently.   Return precautions were discussed with patient who states their understanding.  At the time of discharge patient denied any unaddressed complaints or concerns.  Patient is agreeable for discharge home.   Final Clinical Impressions(s) / ED Diagnoses   Final diagnoses:  Pain, dental    ED Discharge Orders    None       Cristina GongHammond, Elizabeth W, PA-C 09/29/18 0429    Ward, Layla MawKristen N, DO 09/29/18 [phone]

## 2018-09-29 NOTE — ED Triage Notes (Signed)
Pt reports worsening dental x 2 days. Pt has been taking ibuprofen with no relief. No N/V/D or additional symptoms reports.

## 2018-10-21 ENCOUNTER — Other Ambulatory Visit: Payer: Self-pay

## 2018-10-21 ENCOUNTER — Emergency Department (HOSPITAL_COMMUNITY)
Admission: EM | Admit: 2018-10-21 | Discharge: 2018-10-22 | Disposition: A | Discharge status: Home / Self Care | Payer: PRIVATE HEALTH INSURANCE | Attending: Emergency Medicine | Admitting: Emergency Medicine

## 2018-10-21 ENCOUNTER — Encounter (HOSPITAL_COMMUNITY): Payer: Self-pay

## 2018-10-21 DIAGNOSIS — Z87891 Personal history of nicotine dependence: Secondary | ICD-10-CM | POA: Insufficient documentation

## 2018-10-21 DIAGNOSIS — R110 Nausea: Secondary | ICD-10-CM | POA: Insufficient documentation

## 2018-10-21 MED ORDER — SODIUM CHLORIDE 0.9% FLUSH: 3.0000 mL | Freq: Once | INTRAVENOUS | Status: DC

## 2018-10-21 NOTE — ED Triage Notes (Signed)
Pt states that he has been feeling nauseated this evening and called out of work, wants a note, denies abd pain, states he had diarrhea x 1 earlier.

## 2018-10-22 LAB — COMPREHENSIVE METABOLIC PANEL
ALT: 24 U/L (ref 0–44)
AST: 22 U/L (ref 15–41)
Albumin: 4 g/dL (ref 3.5–5.0)
Alkaline Phosphatase: 57 U/L (ref 38–126)
Anion gap: 14 (ref 5–15)
BUN: 10 mg/dL (ref 6–20)
CO2: 17 mmol/L — ABNORMAL LOW (ref 22–32)
Calcium: 8.7 mg/dL — ABNORMAL LOW (ref 8.9–10.3)
Chloride: 107 mmol/L (ref 98–111)
Creatinine, Ser: 0.86 mg/dL (ref 0.61–1.24)
GFR calc Af Amer: >60 mL/min (ref >60)
GFR calc non Af Amer: >60 mL/min (ref >60)
Glucose, Bld: 146 mg/dL — ABNORMAL HIGH (ref 70–99)
Potassium: 3.2 mmol/L — ABNORMAL LOW (ref 3.5–5.1)
Sodium: 138 mmol/L (ref 135–145)
Total Bilirubin: 0.5 mg/dL (ref 0.3–1.2)
Total Protein: 6.8 g/dL (ref 6.5–8.1)

## 2018-10-22 LAB — BASIC METABOLIC PANEL
Anion gap: 11 (ref 5–15)
BUN: 11 mg/dL (ref 6–20)
CO2: 22 mmol/L (ref 22–32)
Calcium: 8.6 mg/dL — ABNORMAL LOW (ref 8.9–10.3)
Chloride: 105 mmol/L (ref 98–111)
Creatinine, Ser: 0.88 mg/dL (ref 0.61–1.24)
GFR calc Af Amer: >60 mL/min (ref >60)
GFR calc non Af Amer: >60 mL/min (ref >60)
Glucose, Bld: 96 mg/dL (ref 70–99)
Potassium: 3.2 mmol/L — ABNORMAL LOW (ref 3.5–5.1)
Sodium: 138 mmol/L (ref 135–145)

## 2018-10-22 LAB — URINALYSIS, ROUTINE W REFLEX MICROSCOPIC
Bilirubin Urine: NEGATIVE
Glucose, UA: NEGATIVE mg/dL
Hgb urine dipstick: NEGATIVE
Ketones, ur: NEGATIVE mg/dL
Leukocytes,Ua: NEGATIVE
Nitrite: NEGATIVE
Protein, ur: NEGATIVE mg/dL
Specific Gravity, Urine: 1.023 (ref 1.005–1.030)
pH: 5 (ref 5.0–8.0)

## 2018-10-22 LAB — CBC
HCT: 39.1 % (ref 39.0–52.0)
Hemoglobin: 12.7 g/dL — ABNORMAL LOW (ref 13.0–17.0)
MCH: 26.9 pg (ref 26.0–34.0)
MCHC: 32.5 g/dL (ref 30.0–36.0)
MCV: 82.8 fL (ref 80.0–100.0)
Platelets: 327 10*3/uL (ref 150–400)
RBC: 4.72 MIL/uL (ref 4.22–5.81)
RDW: 14.7 % (ref 11.5–15.5)
WBC: 8.8 10*3/uL (ref 4.0–10.5)
nRBC: 0 % (ref 0.0–0.2)

## 2018-10-22 LAB — LIPASE, BLOOD: Lipase: 32 U/L (ref 11–51)

## 2018-10-22 MED ORDER — POTASSIUM CHLORIDE CRYS ER 20 MEQ PO TBCR
40.0000 meq | EXTENDED_RELEASE_TABLET | Freq: Once | ORAL | Status: AC
  Administered 2018-10-22: 02:00:00 40 meq via ORAL
  Filled 2018-10-22: qty 2

## 2018-10-22 MED ORDER — ONDANSETRON 4 MG PO TBDP: 4.0000 mg | ORAL_TABLET | Freq: Three times a day (TID) | ORAL | 0 refills | Status: AC | PRN

## 2018-10-22 NOTE — ED Provider Notes (Signed)
[facility] Provider Note   CSN: [id number] Arrival date & time: 10/21/18  2333    History   Chief Complaint Chief Complaint  Patient presents with  . Nausea    HPI Luis Graves is a 33 y.o. male.     Patient here with sensation of nauseated that onset yesterday afternoon but has since resolved.  States he did not have any episodes of vomiting.  No diarrhea, contrary to triage note.  Denies any abdominal pain.  States he was able to eat a chicken salad and have a Pepsi in the waiting room without a problem.  He states he is still here and concerned because he still wanted to get checked out as he missed work today.  He denies any fevers or chills.  Denies any pain with urination or blood in the urine.  No chest pain or shortness of breath.  No abdominal pain.  No previous abdominal surgeries.  No testicular pain.  No pain with urination or blood in the urine.  No sick contacts or recent travel exposures.  The history is provided by the patient.    History reviewed. No pertinent past medical history.  There are no active problems to display for this patient.   Past Surgical History:  Procedure Laterality Date  . CERVICAL FUSION  08/10/2016  . HERNIA REPAIR          Home Medications    Prior to Admission medications   Medication Sig Start Date End Date Taking? Authorizing Provider  HYDROcodone-acetaminophen (NORCO/VICODIN) 5-325 MG tablet Take 1 tablet by mouth every 6 (six) hours as needed for severe pain. 09/14/18   Wurst, [place], PA-C  ibuprofen (ADVIL) 800 MG tablet Take 1 tablet (800 mg total) by mouth 3 (three) times daily. 09/14/18   Rennis HardingWurst, Brittany, PA-C    Family History Family History  Problem Relation Age of Onset  . Healthy Mother   . Healthy Father     Social History Social History   Tobacco Use  . Smoking status: Former Games developermoker  . Smokeless tobacco: Never Used  Substance Use Topics  . Alcohol use: Yes   Comment: occ  . Drug use: No     Allergies   Patient has no known allergies.   Review of Systems Review of Systems  Constitutional: Negative for activity change, appetite change and fever.  HENT: Negative for congestion.   Eyes: Negative for photophobia.  Respiratory: Negative for cough, chest tightness and shortness of breath.   Cardiovascular: Negative for chest pain.  Gastrointestinal: Positive for nausea. Negative for abdominal pain, diarrhea and vomiting.  Genitourinary: Negative for dysuria and hematuria.  Musculoskeletal: Negative for arthralgias and myalgias.  Skin: Negative for rash.  Neurological: Negative for dizziness, weakness, light-headedness and headaches.    all other systems are negative except as noted in the HPI and PMH.    Physical Exam Updated Vital Signs BP 122/77   Pulse 85   Temp 98.2 F (36.8 C) (Oral)   Resp 17   SpO2 97%   Physical Exam Vitals signs and nursing note reviewed.  Constitutional:      General: He is not in acute distress.    Appearance: Normal appearance. He is well-developed and normal weight.  HENT:     Head: Normocephalic and atraumatic.     Mouth/Throat:     Pharynx: No oropharyngeal exudate.  Eyes:     Conjunctiva/sclera: Conjunctivae normal.     Pupils: Pupils are equal, round,  and reactive to light.  Neck:     Musculoskeletal: Normal range of motion and neck supple.     Comments: No meningismus. Cardiovascular:     Rate and Rhythm: Normal rate and regular rhythm.     Heart sounds: Normal heart sounds. No murmur.  Pulmonary:     Effort: Pulmonary effort is normal. No respiratory distress.     Breath sounds: Normal breath sounds.  Abdominal:     Palpations: Abdomen is soft.     Tenderness: There is no abdominal tenderness. There is no guarding or rebound.     Comments: Soft, nontender, nondistended  Musculoskeletal: Normal range of motion.        General: No tenderness.     Comments: No CVA pain  Skin:     General: Skin is warm.     Capillary Refill: Capillary refill takes less than 2 seconds.  Neurological:     General: No focal deficit present.     Mental Status: He is alert and oriented to person, place, and time. Mental status is at baseline.     Cranial Nerves: No cranial nerve deficit.     Motor: No abnormal muscle tone.     Coordination: Coordination normal.     Comments: No ataxia on finger to nose bilaterally. No pronator drift. 5/5 strength throughout. CN 2-12 intact.Equal grip strength. Sensation intact.   Psychiatric:        Behavior: Behavior normal.      ED Treatments / Results  Labs (all labs ordered are listed, but only abnormal results are displayed) Labs Reviewed  COMPREHENSIVE METABOLIC PANEL - Abnormal; Notable for the following components:      Result Value   Potassium 3.2 (*)    CO2 17 (*)    Glucose, Bld 146 (*)    Calcium 8.7 (*)    All other components within normal limits  CBC - Abnormal; Notable for the following components:   Hemoglobin 12.7 (*)    All other components within normal limits  BASIC METABOLIC PANEL - Abnormal; Notable for the following components:   Potassium 3.2 (*)    Calcium 8.6 (*)    All other components within normal limits  LIPASE, BLOOD  URINALYSIS, ROUTINE W REFLEX MICROSCOPIC    EKG None  Radiology No results found.  Procedures Procedures (including critical care time)  Medications Ordered in ED Medications  sodium chloride flush (NS) 0.9 % injection 3 mL (has no administration in time range)     Initial Impression / Assessment and Plan / ED Course  I have reviewed the triage vital signs and the nursing notes.  Pertinent labs & imaging results that were available during my care of the patient were reviewed by me and considered in my medical decision making (see chart for details).       Sensation of nausea that has since resolved.  No vomiting.  No abdominal pain.  Abdomen is soft without peritoneal signs.   Labs are reassuring with normal urinalysis.  Mild metabolic acidosis noted.  This is improved on recheck after oral hydration.  Abdomen is soft without peritoneal signs.  Patient is asymptomatic and tolerating p.o. well.  Normal anion gap.  Potassium replaced. Low suspicion for appendicitis, cholecystitis, other acute surgical problem.  Discussed PCP follow-up, return to the ED with worsening symptoms including right-sided lower abdominal pain, fever, vomiting or other concerns.  Final Clinical Impressions(s) / ED Diagnoses   Final diagnoses:  Nausea    ED Discharge  Orders    None       Glynn Octave, MD 10/22/18 [phone]

## 2018-10-22 NOTE — Discharge Instructions (Addendum)
Your testing is reassuring.  Your labs and urinalysis are normal.  Take the nausea medication as prescribed.  Return to the ED with worsening pain including right lower quadrant abdominal pain, fever, vomiting, not able to eat or drink or any other concerns.

## 2018-10-22 NOTE — ED Notes (Signed)
Pt. States nausea has resolved. Ate chicken salad sandwich in the waiting room with no nausea. No pain.

## 2018-10-22 NOTE — ED Notes (Signed)
Discharge instructions reviewed with pt. Pt has no questions at this time.  

## 2018-10-31 ENCOUNTER — Encounter (HOSPITAL_COMMUNITY): Payer: Self-pay | Admitting: Emergency Medicine

## 2018-10-31 ENCOUNTER — Other Ambulatory Visit: Payer: Self-pay

## 2018-10-31 ENCOUNTER — Ambulatory Visit (HOSPITAL_COMMUNITY)
Admission: EM | Admit: 2018-10-31 | Discharge: 2018-10-31 | Disposition: A | Discharge status: Home / Self Care | Payer: Commercial Managed Care - PPO | Attending: Family Medicine | Admitting: Family Medicine

## 2018-10-31 DIAGNOSIS — R197 Diarrhea, unspecified: Secondary | ICD-10-CM

## 2018-10-31 NOTE — ED Triage Notes (Signed)
Pt presents to [facility] for assessment after "not feeling good" the past week.  Pt states symptoms include nausea, diarrhea.  Denies cough.  Denies abdominal pain.  States now his work wants a note, or wants to keep him out for 2 weeks.

## 2018-11-01 NOTE — ED Provider Notes (Signed)
[facility]   [id number] 10/31/18 Arrival Time: 1904  ASSESSMENT & PLAN:  1. Diarrhea, unspecified type    Improving but still with occasional loose stool. As he works in [employer], will keep him out. See work note provided.  Will do his best to ensure adequate fluid intake in order to avoid dehydration.  May f/u here if not continuing to improve.  Reviewed expectations re: course of current medical issues. Questions answered. Outlined signs and symptoms indicating need for more acute intervention. Patient verbalized understanding. After Visit Summary given.   SUBJECTIVE: History from: patient.  Luis Graves is a 33 y.o. male who presents with complaint of non-bilious, non-bloody intermittent diarrhea. Onset "sometime last week". Abdominal discomfort: mild and cramping initially but none now. Symptoms are gradually improving since beginning. Aggravating factors: none. Alleviating factors: none. Associated symptoms: none. He denies dysuria, fever and myalgias. Appetite: normal. PO intake: normal. Ambulatory without assistance. Urinary symptoms: none. Sick contacts: none. Recent travel or camping: none. OTC treatment: none. Looking for advice about returning to work.  Past Surgical History:  Procedure Laterality Date  . CERVICAL FUSION  08/10/2016  . HERNIA REPAIR      ROS: As per HPI.  OBJECTIVE:  Vitals:   10/31/18 1919  BP: 126/86  Pulse: (!) 106  Resp: 18  Temp: 98.9 F (37.2 C)  TempSrc: Oral  SpO2: 94%    General appearance: alert; no distress Oropharynx: moist Lungs: clear to auscultation bilaterally; unlabored Heart: regular rate and rhythm Abdomen: soft; non-distended; no significant abdominal tenderness; bowel sounds present; no masses or organomegaly; no guarding or rebound tenderness Back: no CVA tenderness Extremities: no edema; symmetrical with no gross deformities Skin: warm; dry Neurologic: normal gait Psychological: alert and  cooperative; normal mood and affect  Labs: Results for orders placed or performed during the hospital encounter of 10/21/18  Lipase, blood  Result Value Ref Range   Lipase 32 11 - 51 U/L  Comprehensive metabolic panel  Result Value Ref Range   Sodium 138 135 - 145 mmol/L   Potassium 3.2 (L) 3.5 - 5.1 mmol/L   Chloride 107 98 - 111 mmol/L   CO2 17 (L) 22 - 32 mmol/L   Glucose, Bld 146 (H) 70 - 99 mg/dL   BUN 10 6 - 20 mg/dL   Creatinine, Ser 7.820.86 0.61 - 1.24 mg/dL   Calcium 8.7 (L) 8.9 - 10.3 mg/dL   Total Protein 6.8 6.5 - 8.1 g/dL   Albumin 4.0 3.5 - 5.0 g/dL   AST 22 15 - 41 U/L   ALT 24 0 - 44 U/L   Alkaline Phosphatase 57 38 - 126 U/L   Total Bilirubin 0.5 0.3 - 1.2 mg/dL   GFR calc non Af Amer >60 >60 mL/min   GFR calc Af Amer >60 >60 mL/min   Anion gap 14 5 - 15  CBC  Result Value Ref Range   WBC 8.8 4.0 - 10.5 K/uL   RBC 4.72 4.22 - 5.81 MIL/uL   Hemoglobin 12.7 (L) 13.0 - 17.0 g/dL   HCT 95.639.1 21.339.0 - 08.652.0 %   MCV 82.8 80.0 - 100.0 fL   MCH 26.9 26.0 - 34.0 pg   MCHC 32.5 30.0 - 36.0 g/dL   RDW 57.814.7 46.911.5 - 62.915.5 %   Platelets 327 150 - 400 K/uL   nRBC 0.0 0.0 - 0.2 %  Urinalysis, Routine w reflex microscopic  Result Value Ref Range   Color, Urine YELLOW YELLOW   APPearance  CLEAR CLEAR   Specific Gravity, Urine 1.023 1.005 - 1.030   pH 5.0 5.0 - 8.0   Glucose, UA NEGATIVE NEGATIVE mg/dL   Hgb urine dipstick NEGATIVE NEGATIVE   Bilirubin Urine NEGATIVE NEGATIVE   Ketones, ur NEGATIVE NEGATIVE mg/dL   Protein, ur NEGATIVE NEGATIVE mg/dL   Nitrite NEGATIVE NEGATIVE   Leukocytes,Ua NEGATIVE NEGATIVE  Basic metabolic panel  Result Value Ref Range   Sodium 138 135 - 145 mmol/L   Potassium 3.2 (L) 3.5 - 5.1 mmol/L   Chloride 105 98 - 111 mmol/L   CO2 22 22 - 32 mmol/L   Glucose, Bld 96 70 - 99 mg/dL   BUN 11 6 - 20 mg/dL   Creatinine, Ser 0.25 0.61 - 1.24 mg/dL   Calcium 8.6 (L) 8.9 - 10.3 mg/dL   GFR calc non Af Amer >60 >60 mL/min   GFR calc Af Amer >60  >60 mL/min   Anion gap 11 5 - 15   Labs Reviewed - No data to display  Imaging: No results found.  No Known Allergies                                             History reviewed. No pertinent past medical history. Social History   Socioeconomic History  . Marital status: Single    Spouse name: Not on file  . Number of children: Not on file  . Years of education: Not on file  . Highest education level: Not on file  Occupational History  . Not on file  Social Needs  . Financial resource strain: Not on file  . Food insecurity:    Worry: Not on file    Inability: Not on file  . Transportation needs:    Medical: Not on file    Non-medical: Not on file  Tobacco Use  . Smoking status: Former Games developer  . Smokeless tobacco: Never Used  Substance and Sexual Activity  . Alcohol use: Yes    Comment: occ  . Drug use: No  . Sexual activity: Not on file  Lifestyle  . Physical activity:    Days per week: Not on file    Minutes per session: Not on file  . Stress: Not on file  Relationships  . Social connections:    Talks on phone: Not on file    Gets together: Not on file    Attends religious service: Not on file    Active member of club or organization: Not on file    Attends meetings of clubs or organizations: Not on file    Relationship status: Not on file  . Intimate partner violence:    Fear of current or ex partner: Not on file    Emotionally abused: Not on file    Physically abused: Not on file    Forced sexual activity: Not on file  Other Topics Concern  . Not on file  Social History Narrative  . Not on file   Family History  Problem Relation Age of Onset  . Healthy Mother   . Healthy Father      Luis Layman, MD 11/01/18 [phone]
